# Patient Record
Sex: Female | Born: 1960 | Race: White | Hispanic: No | Smoking: Never smoker
Health system: Southern US, Community
[De-identification: ages and names within clinical notes are randomized; demographics above are authoritative.]

## PROBLEM LIST (undated history)

## (undated) DIAGNOSIS — E785 Hyperlipidemia, unspecified: Secondary | ICD-10-CM

## (undated) DIAGNOSIS — J301 Allergic rhinitis due to pollen: Secondary | ICD-10-CM

## (undated) DIAGNOSIS — F32A Depression, unspecified: Secondary | ICD-10-CM

## (undated) DIAGNOSIS — G8929 Other chronic pain: Secondary | ICD-10-CM

## (undated) DIAGNOSIS — F419 Anxiety disorder, unspecified: Secondary | ICD-10-CM

## (undated) DIAGNOSIS — F329 Major depressive disorder, single episode, unspecified: Secondary | ICD-10-CM

## (undated) DIAGNOSIS — G43909 Migraine, unspecified, not intractable, without status migrainosus: Secondary | ICD-10-CM

## (undated) DIAGNOSIS — D649 Anemia, unspecified: Secondary | ICD-10-CM

## (undated) DIAGNOSIS — M797 Fibromyalgia: Secondary | ICD-10-CM

## (undated) DIAGNOSIS — K219 Gastro-esophageal reflux disease without esophagitis: Secondary | ICD-10-CM

## (undated) DIAGNOSIS — M549 Dorsalgia, unspecified: Secondary | ICD-10-CM

## (undated) DIAGNOSIS — N201 Calculus of ureter: Secondary | ICD-10-CM

## (undated) DIAGNOSIS — K5909 Other constipation: Secondary | ICD-10-CM

## (undated) DIAGNOSIS — I10 Essential (primary) hypertension: Secondary | ICD-10-CM

## (undated) DIAGNOSIS — I82409 Acute embolism and thrombosis of unspecified deep veins of unspecified lower extremity: Secondary | ICD-10-CM

## (undated) DIAGNOSIS — K648 Other hemorrhoids: Secondary | ICD-10-CM

## (undated) HISTORY — DX: Fibromyalgia: M79.7

## (undated) HISTORY — DX: Anxiety disorder, unspecified: F41.9

## (undated) HISTORY — DX: Acute embolism and thrombosis of unspecified deep veins of unspecified lower extremity: I82.409

## (undated) HISTORY — DX: Depression, unspecified: F32.A

## (undated) HISTORY — DX: Other hemorrhoids: K64.8

## (undated) HISTORY — DX: Other chronic pain: G89.29

## (undated) HISTORY — DX: Anemia, unspecified: D64.9

## (undated) HISTORY — DX: Dorsalgia, unspecified: M54.9

## (undated) HISTORY — DX: Other constipation: K59.09

## (undated) HISTORY — PX: WISDOM TOOTH EXTRACTION: SHX21

## (undated) HISTORY — DX: Hyperlipidemia, unspecified: E78.5

## (undated) HISTORY — DX: Migraine, unspecified, not intractable, without status migrainosus: G43.909

## (undated) HISTORY — DX: Major depressive disorder, single episode, unspecified: F32.9

## (undated) HISTORY — DX: Calculus of ureter: N20.1

## (undated) HISTORY — DX: Allergic rhinitis due to pollen: J30.1

## (undated) HISTORY — DX: Essential (primary) hypertension: I10

## (undated) HISTORY — DX: Gastro-esophageal reflux disease without esophagitis: K21.9

---

## 1998-11-17 ENCOUNTER — Other Ambulatory Visit: Admission: RE | Admit: 1998-11-17 | Discharge: 1998-11-17 | Payer: Self-pay | Admitting: Obstetrics and Gynecology

## 1998-12-03 ENCOUNTER — Encounter (INDEPENDENT_AMBULATORY_CARE_PROVIDER_SITE_OTHER): Payer: Self-pay | Admitting: Specialist

## 1998-12-03 ENCOUNTER — Inpatient Hospital Stay (HOSPITAL_COMMUNITY): Admission: RE | Admit: 1998-12-03 | Discharge: 1998-12-05 | Payer: Self-pay | Admitting: Obstetrics and Gynecology

## 1999-03-07 HISTORY — PX: PARTIAL HYSTERECTOMY: SHX80

## 2000-02-14 ENCOUNTER — Other Ambulatory Visit: Admission: RE | Admit: 2000-02-14 | Discharge: 2000-02-14 | Payer: Self-pay | Admitting: Obstetrics and Gynecology

## 2000-02-14 ENCOUNTER — Encounter (INDEPENDENT_AMBULATORY_CARE_PROVIDER_SITE_OTHER): Payer: Self-pay

## 2002-01-01 ENCOUNTER — Inpatient Hospital Stay (HOSPITAL_COMMUNITY): Admission: EM | Admit: 2002-01-01 | Discharge: 2002-01-07 | Payer: Self-pay | Admitting: Psychiatry

## 2002-01-15 ENCOUNTER — Inpatient Hospital Stay (HOSPITAL_COMMUNITY): Admission: EM | Admit: 2002-01-15 | Discharge: 2002-01-20 | Payer: Self-pay | Admitting: Psychiatry

## 2002-03-20 ENCOUNTER — Ambulatory Visit (HOSPITAL_BASED_OUTPATIENT_CLINIC_OR_DEPARTMENT_OTHER): Admission: RE | Admit: 2002-03-20 | Discharge: 2002-03-20 | Payer: Self-pay | Admitting: Urology

## 2002-03-20 ENCOUNTER — Encounter: Payer: Self-pay | Admitting: Urology

## 2002-07-03 ENCOUNTER — Other Ambulatory Visit: Admission: RE | Admit: 2002-07-03 | Discharge: 2002-07-03 | Payer: Self-pay | Admitting: Obstetrics and Gynecology

## 2004-04-12 ENCOUNTER — Ambulatory Visit (HOSPITAL_COMMUNITY): Admission: RE | Admit: 2004-04-12 | Discharge: 2004-04-12 | Payer: Self-pay | Admitting: *Deleted

## 2005-08-07 ENCOUNTER — Encounter: Admission: RE | Admit: 2005-08-07 | Discharge: 2005-08-07 | Payer: Self-pay | Admitting: Endocrinology

## 2006-04-11 ENCOUNTER — Emergency Department (HOSPITAL_COMMUNITY): Admission: EM | Admit: 2006-04-11 | Discharge: 2006-04-11 | Payer: Self-pay | Admitting: Emergency Medicine

## 2007-03-07 HISTORY — PX: BUNIONECTOMY: SHX129

## 2007-04-03 ENCOUNTER — Encounter: Payer: Self-pay | Admitting: Emergency Medicine

## 2007-04-04 ENCOUNTER — Encounter (INDEPENDENT_AMBULATORY_CARE_PROVIDER_SITE_OTHER): Payer: Self-pay | Admitting: Internal Medicine

## 2007-04-04 ENCOUNTER — Observation Stay (HOSPITAL_COMMUNITY): Admission: EM | Admit: 2007-04-04 | Discharge: 2007-04-04 | Payer: Self-pay | Admitting: Internal Medicine

## 2007-08-20 ENCOUNTER — Observation Stay (HOSPITAL_COMMUNITY): Admission: AD | Admit: 2007-08-20 | Discharge: 2007-08-21 | Payer: Self-pay | Admitting: Endocrinology

## 2007-08-21 ENCOUNTER — Ambulatory Visit: Payer: Self-pay | Admitting: Vascular Surgery

## 2007-08-21 ENCOUNTER — Encounter (INDEPENDENT_AMBULATORY_CARE_PROVIDER_SITE_OTHER): Payer: Self-pay | Admitting: Internal Medicine

## 2008-06-16 ENCOUNTER — Emergency Department (HOSPITAL_BASED_OUTPATIENT_CLINIC_OR_DEPARTMENT_OTHER): Admission: EM | Admit: 2008-06-16 | Discharge: 2008-06-17 | Payer: Self-pay | Admitting: Emergency Medicine

## 2009-06-17 ENCOUNTER — Encounter: Admission: RE | Admit: 2009-06-17 | Discharge: 2009-06-17 | Payer: Self-pay | Admitting: Neurosurgery

## 2010-06-15 LAB — POCT TOXICOLOGY PANEL

## 2010-06-15 LAB — URINALYSIS, ROUTINE W REFLEX MICROSCOPIC
Bilirubin Urine: NEGATIVE
Hgb urine dipstick: NEGATIVE
Nitrite: NEGATIVE
Specific Gravity, Urine: 1.012 (ref 1.005–1.030)
pH: 6.5 (ref 5.0–8.0)

## 2010-06-22 ENCOUNTER — Ambulatory Visit (INDEPENDENT_AMBULATORY_CARE_PROVIDER_SITE_OTHER): Payer: BC Managed Care – PPO | Admitting: Gastroenterology

## 2010-06-22 ENCOUNTER — Encounter: Payer: Self-pay | Admitting: Gastroenterology

## 2010-06-22 VITALS — BP 112/74 | HR 80 | Ht 66.0 in | Wt 130.0 lb

## 2010-06-22 DIAGNOSIS — R1319 Other dysphagia: Secondary | ICD-10-CM

## 2010-06-22 DIAGNOSIS — K5900 Constipation, unspecified: Secondary | ICD-10-CM

## 2010-06-22 DIAGNOSIS — K219 Gastro-esophageal reflux disease without esophagitis: Secondary | ICD-10-CM

## 2010-06-22 MED ORDER — OMEPRAZOLE 40 MG PO CPDR: 40.0000 mg | DELAYED_RELEASE_CAPSULE | Freq: Two times a day (BID) | ORAL | Status: DC

## 2010-06-22 NOTE — Patient Instructions (Signed)
You have been scheduled for a Upper Endoscopy at [facility]. Separate instruction sheet given. Your prescription has been sent to your pharmacy.  Cc: Pearson Grippe, MD

## 2010-06-22 NOTE — Progress Notes (Signed)
History of Present Illness: This is a 50 year old female with a several year history of GERD maintained on omeprazole 40mg  po bid. She has frequent throat clearing felt possible due to GERD. She was recently increased to omeprazole 40mg  po qid without any improvement in her symptoms. She notes solid food dysphagia for the past 3 months that happens almost on a daily basis. She states she underwent upper endoscopy by Dr. Virginia Rochester but I cannot locate the records. She has chronic constipation and is maintained on Amitiza and Senna with good control of her symptoms. She previously underwent colonoscopy by Dr. Virginia Rochester in February 2006 which showed internal and no other abnormalities.  She denies painful swallowing, weight loss, abdominal pain, rectal pain, melena, hematochezia, chest pain.  Past Medical History  Diagnosis Date  . DVT (deep venous thrombosis)   . Depression   . Migraines   . Chronic back pain   . Left ureteral calculus   . Anxiety   . Hypertension   . Internal hemorrhoids   . Fibromyalgia   . GERD (gastroesophageal reflux disease)   . Chronic constipation   . Vitamin D deficiency   . Hyperlipidemia   . Hayfever    Past Surgical History  Procedure Date  . Cesarean section 1984  . Partial hysterectomy 2001  . Bunionectomy 2009    reports that she has never smoked. She has never used smokeless tobacco. She reports that she does not drink alcohol or use illicit drugs. family history includes Bipolar disorder in her mother; Diabetes in her maternal grandmother; Hyperlipidemia in her father; Osteoarthritis in her mother; and Stroke in her maternal grandfather.  There is no history of Colon cancer. Allergies  Allergen Reactions  . Mirapex (Pramipexole Dihydrochloride)   . Sulfa Drugs Cross Reactors    Outpatient Encounter Prescriptions as of 06/22/2010  Medication Sig Dispense Refill  . buPROPion (WELLBUTRIN XL) 300 MG 24 hr tablet Take 300 mg by mouth daily.        . Cholecalciferol  (VITAMIN D3) 2000 UNITS TABS Take 1 tablet by mouth daily.        Marland Kitchen gabapentin (NEURONTIN) 600 MG tablet Take 600 mg by mouth 3 (three) times daily.        Marland Kitchen lamoTRIgine (LAMICTAL) 100 MG tablet Take 100 mg by mouth 2 (two) times daily.        Marland Kitchen lubiprostone (AMITIZA) 8 MCG capsule Take 16 mcg by mouth. 2 in the morning and 2 at night      . mirtazapine (REMERON) 15 MG tablet Take 7.5 mg by mouth at bedtime.        . Multiple Vitamin (MULTIVITAMIN) tablet Take 1 tablet by mouth daily.        . NON FORMULARY Allergy shot twice weekly       . omeprazole (PRILOSEC) 40 MG capsule Take 1 capsule (40 mg total) by mouth 2 (two) times daily.  60 capsule  11  . Sennosides (SENNA LAX PO) Take by mouth. 4 in the morning and 4 at night       . SUMAtriptan (IMITREX) 50 MG tablet Take 50 mg by mouth as needed.        . topiramate (TOPAMAX) 50 MG tablet 50 mg. 2 tablets by mouth in the morning and 1 tablet in the evening.       . traZODone (DESYREL) 100 MG tablet Take 100 mg by mouth at bedtime.        . triamterene-hydrochlorothiazide (MAXZIDE-25)  37.5-25 MG per tablet Take 1 tablet by mouth daily.        Marland Kitchen DISCONTD: omeprazole (PRILOSEC) 40 MG capsule Take 40 mg by mouth. Takes one tablet by four times daily      . DISCONTD: polyethylene glycol (MIRALAX / GLYCOLAX) packet Take 17 g by mouth every other day.           Review of Systems: Back pain, headaches, increased frequency of urination and vocal changes. Pertinent positive and negative review of systems were noted in the above HPI section. All other review of systems were otherwise negative.  Physical Exam: General: Well developed , well nourished, no acute distress Head: Normocephalic and atraumatic Eyes:  sclerae anicteric, EOMI Ears: Normal auditory acuity Mouth: No deformity or lesions Neck: Supple, no masses or thyromegaly Lungs: Clear throughout to auscultation Heart: Regular rate and rhythm; no murmurs, rubs or bruits Abdomen: Soft, non  tender and non distended. No masses, hepatosplenomegaly or hernias noted. Normal Bowel sounds Musculoskeletal: Symmetrical with no gross deformities  Skin: No lesions on visible extremities Pulses:  Normal pulses noted Extremities: No clubbing, cyanosis, edema or deformities noted Neurological: Alert oriented x 4, grossly nonfocal Cervical Nodes:  No significant cervical adenopathy Inguinal Nodes: No significant inguinal adenopathy Psychological:  Alert and cooperative. Normal mood and affect  Assessment and Recommendations:  1. GERD. Possible LPR. Continue omeprazole 40 mg twice daily and standard antireflux measures. I offered to change her to another proton pump inhibitor to attempt to control her throat clearing, which may be secondary to reflux, however she prefers to remain on omeprazole.  2. Dysphasia. Suspected peptic stricture. Rule out other etiologies including neoplasms. The risks, benefits, and alternatives to endoscopy with possible biopsy and possible dilation were discussed with the patient and they consent to proceed. Given her medications and her sedation requirements for her colonoscopy I recommended that she undergo sedation with propofol however she declined due to the timing and scheduling.   3. Colorectal cancer screening. Average risk for colorectal cancer. Screening colonoscopy February 2016.

## 2010-06-27 ENCOUNTER — Encounter: Payer: Self-pay | Admitting: Gastroenterology

## 2010-06-28 ENCOUNTER — Telehealth: Payer: Self-pay | Admitting: *Deleted

## 2010-06-28 ENCOUNTER — Encounter: Payer: Self-pay | Admitting: Gastroenterology

## 2010-06-28 ENCOUNTER — Telehealth: Payer: Self-pay | Admitting: Gastroenterology

## 2010-06-28 NOTE — Telephone Encounter (Signed)
Spoke with patient. Advised to change her diet at midnight tonight to prep for EGD. She verbalized understanding.

## 2010-06-28 NOTE — Telephone Encounter (Signed)
Spoke with pt. Advised to start EGD prep diet at midnight.

## 2010-06-29 ENCOUNTER — Encounter: Payer: Self-pay | Admitting: Gastroenterology

## 2010-06-29 ENCOUNTER — Ambulatory Visit (AMBULATORY_SURGERY_CENTER): Payer: BC Managed Care – PPO | Admitting: Gastroenterology

## 2010-06-29 DIAGNOSIS — K2970 Gastritis, unspecified, without bleeding: Secondary | ICD-10-CM

## 2010-06-29 DIAGNOSIS — R1319 Other dysphagia: Secondary | ICD-10-CM

## 2010-06-29 DIAGNOSIS — R1310 Dysphagia, unspecified: Secondary | ICD-10-CM

## 2010-06-29 DIAGNOSIS — K2940 Chronic atrophic gastritis without bleeding: Secondary | ICD-10-CM

## 2010-06-29 DIAGNOSIS — K219 Gastro-esophageal reflux disease without esophagitis: Secondary | ICD-10-CM

## 2010-06-29 DIAGNOSIS — K2990 Gastroduodenitis, unspecified, without bleeding: Secondary | ICD-10-CM

## 2010-06-29 MED ORDER — SODIUM CHLORIDE 0.9 % IV SOLN: 500.0000 mL | INTRAVENOUS | Status: DC

## 2010-06-29 NOTE — Patient Instructions (Signed)
Discharged instructions given with verbal understanding.  Handouts on gastritis, gerd, and a dilatation diet given. Resume previous medications.

## 2010-06-30 ENCOUNTER — Telehealth: Payer: Self-pay

## 2010-06-30 NOTE — Telephone Encounter (Signed)

## 2010-07-01 ENCOUNTER — Telehealth: Payer: Self-pay

## 2010-07-01 NOTE — Telephone Encounter (Signed)
Patient notified of GES scheduled for [facility] 07/19/10 8:00

## 2010-07-04 ENCOUNTER — Encounter: Payer: Self-pay | Admitting: Gastroenterology

## 2010-07-07 ENCOUNTER — Telehealth: Payer: Self-pay | Admitting: Gastroenterology

## 2010-07-07 NOTE — Telephone Encounter (Signed)
Patient is advised to keep her appt for GES

## 2010-07-11 ENCOUNTER — Telehealth: Payer: Self-pay | Admitting: Gastroenterology

## 2010-07-12 ENCOUNTER — Telehealth: Payer: Self-pay

## 2010-07-12 MED ORDER — PANTOPRAZOLE SODIUM 40 MG PO TBEC: 40.0000 mg | DELAYED_RELEASE_TABLET | Freq: Two times a day (BID) | ORAL | Status: DC

## 2010-07-12 NOTE — Telephone Encounter (Signed)
Spoke with patient and informed her of Dr. Ardell Isaacs recommendations. She states she has not tried Protonix but thinks she has tried the others. I told her that we would send in the Pantoprazole and she can call us back if this medication does not help. Pt agreed and verbalized understanding.

## 2010-07-12 NOTE — Telephone Encounter (Signed)
Left a message for pt to call me back

## 2010-07-12 NOTE — Telephone Encounter (Signed)
Trial of a stonger PPI bid: Nexium 40 mg or pantoprazole 40 mg or Aciphex 20 mg in place of omeprazole

## 2010-07-12 NOTE — Telephone Encounter (Signed)
Left another message with patient letting her know that we will send another PPI to her pharmacy in place of the omeprazole. Told her to call me back to let me know which PPI she has tried in the past and I can send her to her pharmacy.

## 2010-07-12 NOTE — Telephone Encounter (Signed)
Patient states she is having a root canal today and states she is still paying on other bills that she cannot afford. She states she would like to cancel her GES at this time due to insufficient funds. She wants to see if there is something else we could do as far as medication changes to help with this problem. I told her of the importance of this test but she still would like to cancel at this time. Please advise.

## 2010-07-18 ENCOUNTER — Telehealth: Payer: Self-pay | Admitting: Gastroenterology

## 2010-07-18 NOTE — Telephone Encounter (Signed)
Patient states she is still having solid food dysphagia and continued globus sensation in her throat even after the EGD with dilitation. She also states after switching her PPI to pantoprazole from omeprazole, it has not really helped her symptoms even when increased to twice a day. I asked patient if she tried anti-reflux measures with increasing her PPI. She states she has tried everything.  I told her that we might have to switch her medicine again or she might have to decide to pay for the GES that Dr. Russella Dar recommended in her EGD report. Pt states she would really like see if there was a alternate thing that could be done with medicine first. Told patient that I would ask. Please advise.

## 2010-07-18 NOTE — Telephone Encounter (Signed)
I have not found any definite GI problem causing her symptoms. She might have GERD so a PPI that she is willing to take could be helpful. A GES could help determine if she has gastroparesis exacerbating her symptoms. She might have an ENT or other disorder causing globus and swallowing problems so she needs an ENT evaluation as the next step.

## 2010-07-19 ENCOUNTER — Other Ambulatory Visit (HOSPITAL_COMMUNITY): Payer: BC Managed Care – PPO

## 2010-07-19 MED ORDER — ESOMEPRAZOLE MAGNESIUM 40 MG PO CPDR: 40.0000 mg | DELAYED_RELEASE_CAPSULE | Freq: Two times a day (BID) | ORAL | Status: DC

## 2010-07-19 NOTE — H&P (Signed)
NAMECHASITTY, HEHL NO.:  [id number]   MEDICAL RECORD NO.:  [id number]          PATIENT TYPE:  INP   LOCATION:  1530                         FACILITY:  [facility]   PHYSICIAN:  Lonia Blood, M.D.       DATE OF BIRTH:  11/22/1960   DATE OF ADMISSION:  08/20/2007  DATE OF DISCHARGE:                              HISTORY & PHYSICAL   PRIMARY CARE PHYSICIAN:  Brooke Bonito, M.D.   CHIEF COMPLAINT:  Left side pain.   HISTORY OF PRESENT ILLNESS:  Ms. Feuerstein is a 50 year old woman with a  past medical history of depression, who presented to her primary care  physician today with complaints of severe left leg pain.  The patient  reports that she and been having this pain for the past 4 days.  According to her husband, the patient has been on regiment of tight Ace  wraps to the left and right knee, and she has been using them to  stabilize a ballotable patella.  The patient denies any prior deep  venous thrombosis.  She denies any prior PEs.  Denies any family history  of clotting disorders.  The patient denies chest pain, shortness of  breath, coughing and/or hemoptysis.   PAST MEDICAL HISTORY:  .  Depression.  1. Chronic migraines.  2. Chronic back pain.  3. Hysterectomy.  4. Left ureteral calculus.  5. Anxiety.  6. Hypertension.   HOME MEDICATIONS.:  1. Pristiq.  2. Ritalin 20 mg 1-1/2 tablets in the morning and 1 tablet at noon.  3. Neurontin 1200 mg 3 times a day.  4. Wellbutrin XL 450 mg in the morning.  5. Lamictal 100 mg in the morning.  6. Lunesta 3 mg at that time.  7. Topamax 50 mg at 7 a.m. and 50 mg at 3:00 p.m.  8. Triamterene/hydrochlorothiazide 37.5 mg daily.  9. Tramadol 50 mg every 8 hours as needed.  10.Zomig 5 mg as needed for migraines.  11.Zanaflex 4 mg every 8 hours as needed for muscle relaxation.  12.MiraLax 17 grams daily as needed for constipation.  13.Multivitamin daily.   SOCIAL HISTORY:  The patient is married.  Denies any smoking or  alcohol.   FAMILY HISTORY:  Positive for stroke in grandfather.  Positive for  atherosclerosis in father.   ALLERGIES:  THE PATIENT IS ALLERGIC TO SULFA, PENICILLIN AND STATINS.   REVIEW OF SYSTEMS:  As per HPI, other systems are reviewed and are  negative.   PHYSICAL EXAMINATION:  Upon admission, temperature 97.1, pulse 95,  respirations 16, blood pressure 134/73, saturation 96% on room air.  She  appears a well-developed, well-nourished woman, in no acute distress.  Sitting on a stretcher, alert and oriented to place, person, and time.  HEENT:  Head normocephalic, atraumatic.  Eyes, pupils equal and round,  react to light and accommodation.  Extraocular movements intact, wearing  glasses.  Throat clear.  NECK:  Supple.  No JVD.  CHEST: Clear to auscultation without wheezes, rhonchi or crackles.  HEART:  Regular rate and rhythm, without murmurs, rubs or gallops.  ABDOMEN:  Soft,  nontender.  Normoactive bowel sounds are present.  LOWER EXTREMITIES: +1 edema on the left, no edema on the right.  Homans  sign  is positive on the left.  SKIN:  Warm, dry without any suspicious rashes with a tan from an  artificial tanning bed.   LABORATORY VALUES:  On admission, everything is pending.  There are  venous Dopplers from the outside office that is positive for partially  occluded popliteal vein with a deep venous thrombosis with partial  recanalization on the left.   ASSESSMENT AND PLAN:  1. Deep venous thrombosis.  I will place Ms. Rodrigue on observation      overnight.  Repeat her Dopplers tomorrow.  I do not find signs of      acute pulmonary emboli, but will closely monitor the patient.  She      will be started on Lovenox and Coumadin and educated through the      night.  2. Depression.  Ms. Alcorta will be continued on her chronic      medications including Pristiq, Wellbutrin, Topamax.  3. Chronic pain syndrome.  The patient will be continued on Neurontin      and  Tramadol.      Lonia Blood, M.D.  Electronically Signed     SL/MEDQ  D:  08/20/2007  T:  08/21/2007  Job:  [id number]   cc:   Brooke Bonito, M.D.  Fax: [phone]

## 2010-07-19 NOTE — Telephone Encounter (Signed)
Patient states she is willing to try another PPI. She would like to try Nexium twice daily. I told her I would send this to her pharmacy. Told her what Dr. Russella Dar recommended for the next step with a ENT referral. Patient states she thinks she saw a ENT doctor many years ago and they did a lot of tests on her and she states she did not like the doctor. She states she does not want another referral at this time to ENT but would try another medicine. She also does not want to do the GES at this time still.

## 2010-07-19 NOTE — Discharge Summary (Signed)
NAMEGWENITH, [name]               ACCOUNT NO.:  [id number]   MEDICAL RECORD NO.:  [id number]          PATIENT TYPE:  INP   LOCATION:  1530                         [facility]ACILITY:  [facility]   PHYSICIAN:  Hillery Aldo, M.D.   DATE OF BIRTH:  June 10, 1960   DATE OF ADMISSION:  08/20/2007  DATE OF DISCHARGE:  08/21/2007                               DISCHARGE SUMMARY   PRIMARY CARE PHYSICIAN:  Brooke Bonito, M.D.   DISCHARGE DIAGNOSES:  1. Deep venous thrombosis, resolving.  2. Depression.  3. History of migraine headaches.  4. Chronic back pain.  5. History of left ureteral calculus.  6. Anxiety disorder.  7. Hypertension.  8. Hypokalemia.   DISCHARGE MEDICATIONS:  1. Pristiq 150 mg, 3 tablets q.a.m.  2. Methylphenidate 20 mg, 1-1/2 tablets in the morning, 12:00 p.m. and      1:00 p.m.  3. Gabapentin 600 mg 2 tablets t.i.d.  4. Alprazolam 1 mg q.i.d.  5. Wellbutrin XL 150 mg 3 tablets q.a.m.  6. Lamictal 100 mg, q.a.m.  7. Lunesta 3 mg nightly p.r.n.  8. Topamax 50 mg every 7:00 a.m. and 3:00 p.m.  9. Triamterine hydrochlorothiazide 37.5/25 one tablet daily.  10.Tramadol 50 mg every 8 hours p.r.n.  11.Xomed 5 mg p.r.n. migraine headaches.  12.Zanaflex 4 mg every 8 hours p.r.n.  13.Polyethelene glycol 17 grams daily as needed.  14.Ultimate Woman 2 tablets daily.  15.Coumadin 7.5 mg daily or as directed.  16.Lovenox 110 mg by injection subcutaneously daily until instructed      to discontinue.   CONSULTATIONS:  Pharmacy for Coumadin/Lovenox dosing.   BRIEF ADMISSION HISTORY OF PRESENT ILLNESS:  Patient is a 50 year old  female who was directed to come to the emergency department by her  primary care physician after she was found to have evidence of a deep  venous thrombosis.  Apparently, she had a Doppler done at her  physician's office, which showed a partial recannulization of a left  popliteal DVT.  She had 2 weeks of swelling prior to this.  Apparently,  she has been treating  chronic patella pain with bands that support her  patellas bilaterally, but are likely somewhat restrictive.  She was  admitted for initiation of anticoagulation therapy.   PROCEDURES/DIAGNOSTIC STUDIES:  Repeat bilateral Dopplers on August 21, 2007 showed negative for DVT bilaterally with the left popliteal vein  patent.  There was superficial thrombus noted in the left lesser  saphenous vein.   DISCHARGE LABORATORY VALUES:  Chemistries were normal with the exception  of slightly low potassium at 3.4.  PT was 12.2, INR 0.9.  CBC was within  the normal limits.   HOSPITAL COURSE BY PROBLEM:  1. DVT:  Patient was admitted with a presumptive diagnosis of DVT      based on outpatient Dopplers.  Dopplers were repeated and showed      clearance of the deep venous thrombosis and ongoing clot in the      superficial lesser saphenous vein.  Given the preponderance of the      evidence that she likely did have a deep  venous clot that has      recannulized, I think it is reasonable to treat her for 6 months      with anticoagulation therapy.  She has been instructed to      discontinue use of the patella supports.  At this time, I do not      think a hypercoagulability workup is needed.  Patient will be      taught how to self-administer Lovenox and be discontinued on a      combination of Lovenox and Coumadin.  She is instructed to follow      up with Dr. Juleen China for lab work daily until instructed to stop.  2. Hypokalemia:  Patient was given appropriate p.o. repletion prior to      discharge and put on daily supplementation given that she is being      directed with a diuretic.   DISPOSITION:  Patient is medically stable and will be discharged home.      Hillery Aldo, M.D.  Electronically Signed     CR/MEDQ  D:  08/21/2007  T:  08/21/2007  Job:  [id number]   cc:   Brooke Bonito, M.D.  Fax: [phone]

## 2010-07-19 NOTE — Telephone Encounter (Signed)
Addended by: Christie Nottingham on: 07/19/2010 12:06 PM   Modules accepted: Orders

## 2010-07-19 NOTE — H&P (Signed)
[name], [name]               ACCOUNT NO.:  [id number]   MEDICAL RECORD NO.:  [id number]          PATIENT TYPE:  EMS   LOCATION:  ED                           FACILITY:  [facility]   PHYSICIAN:  Herbie Saxon, MDDATE OF BIRTH:  [birth year]   DATE OF ADMISSION:  04/03/2007  DATE OF DISCHARGE:                              HISTORY & PHYSICAL   She is a full code.   PRESENTING COMPLAINT:  Chest pain, 4 1/2 hours.   HISTORY OF PRESENTING COMPLAINT:  This is a 50 year old female who had  been in good state of health until a week ago when she took some  Celebrex for back pain and she started having intermittent diarrhea.  However, tonight she was reading in bed around 6:30 p.m., when suddenly  developed severe retrosternal chest pain which started under her left  breast, radiating to her back, associated with shortness of breath,  diaphoresis, nausea, lightheadedness.  She denies any frank syncopal  episode, no palpitations, no paroxysmal nocturnal dyspnea, no orthopnea,  no pedal swelling.  Patient also has intermittent headache because of  her history of migraine.  Patient had no preceding cough, fever or  wheezing, no hemoptysis.  There is no new skin rash or joint swelling.  Patient's chest pain  was ameliorated after nitroglycerin and aspirin at  the emergency room. Chest pain at present is 2/10, initially was 10/10  at presentation.  She had no preceding history of chest pain and  actually patient used to do exercises at 45 minutes on the elliptical at  home with no symptoms.   PAST MEDICAL HISTORY:  1. Migraine headache.  2. Depression.  3. Constipation.  4. Chronic back pain.  5. Peripheral edema.   FAMILY HISTORY:  Grandfather had CVA.   SOCIAL HISTORY:  She is married.  She has one child.  She does not  drink, smoke or use illicit drugs.   PAST SURGERIES:  1. Hysterectomy.  2. Cesarean section.  3. Ovarian cyst.  4. Ectopic pregnancy.   REVIEW OF SYSTEMS:   Twelve systems reviewed, pertinent positives as  already dictated in the history of presenting complaint.   MEDICATIONS:  1. Triamterine/hydrochlorothiazide 37.5/25 one daily.  2. Tramadol 50 mg q.8 h. p.r.n  3. Gabapentin 600 mg t.i.d.  4. Alprazolam 1 mg q.6 h.  5. Wellbutrin XL 150 mg daily.  6. Lamictal 100 mg daily.  7. Lunesta 3 mg night  8. Zanaflex 5 mg p.r.n.  9. Topamax 50 mg b.i.d.  10.MiraLax 17 gm p.r.n.  11.Multivitamin 1 tablet daily.  12.KCl 99 mg daily.   She is allergic to PENICILLIN AND SULFA.   On examination, she is a middle-aged lady not in acute respiratory  distress.  Temperature is 98, pulse 76, respiratory rate 20, blood pressure 131/83.  Pupils equal, round, reactive to light in accommodation.  Extraocular  muscles intact.  HEAD:  Atraumatic, normocephalic.  Mucous membranes are moist.  NECK:  Supple, no elevated JVD, no thyromegaly.  CHEST:  Clinically clear.  HEART:  Sounds 1 and 2, regular rate and rhythm.  ABDOMEN:  Benign.  She is alert relative to time, place and person.  Cranial nerves II-XII  intact.  __________ globally.  Sensory system is normal.  Peripheral pulses present, no pedal edema.    Available labs show the EKG is normal sinus rhythm at 73 per minute, SVT  abnormality inferior lateral and anterior leads.  WBCs 11, hematocrit is  40, platelet count is 329.  Troponin is less than 0.05.  Sodium is 140,  potassium 3.1, chloride 105, bicarbonate 27, glucose 105, BUN 15,  creatinine 1.   ASSESSMENT:  Atypical chest pain.  Rule out acute coronary syndrome,  leukocytosis, hypokalemiasecondary to gastroenteritis.   The patient is to be admitted to telemetry bed for observation with  serial cardiac enzymes and EKG.  She is to be on morphine and oxygen  p.r.n., nitroglycerin paste 1/2 inch q.6 h., aspirin 325 mg daily.  We  will obtain a D-dimer, a CT chest and a stool for WBC, ova and parasite.  We will get fasting lipids and put her  on Lovenox 40 mg subcu daily and  Protonix 40 mg IV daily, with some Flagyl 500 mg p.o. t.i.d., Reglan  alternating with Phenergan p.r.n. for nausea.  DuoNeb q.6 h. p.r.n. for  wheezing.  Continue the home medications, hold the hydrochlorothiazide  because of the hypokalemia.   Her illness, medication and tests were explained to her, to which she  verbalized return understanding.  We are going to keep her NPO from  midnight for possible stress echo in a.m. and consider cardiology input  in a.m.  if cardiac enzymes are positive.      Herbie Saxon, MD  Electronically Signed    MIO/MEDQ  D:  04/03/2007  T:  04/04/2007  Job:  [id number]

## 2010-07-19 NOTE — Discharge Summary (Signed)
[name], [name]               ACCOUNT NO.:  [id number]   MEDICAL RECORD NO.:  [id number]          PATIENT TYPE:  INP   LOCATION:  4740                         FACILITY:  [facility]   PHYSICIAN:  Elliot Cousin, M.D.    DATE OF BIRTH:  April 21, 1960   DATE OF ADMISSION:  04/04/2007  DATE OF DISCHARGE:  04/04/2007                               DISCHARGE SUMMARY   DISCHARGE DIAGNOSES:  1. Chest pain, myocardial infarction ruled out.  Most likely etiology      is musculoskeletal in origin.  2. Leukocytosis.  Resolved during hospital course.  3. Hypokalemia.   DISCHARGE MEDICATIONS:  1. Vicodin 5 mg every 4-6 hours as needed (10 tablets prescribed).  2. Potassium chloride 20 mEq b.i.d. for 1 week.  3. Motrin or Tylenol as directed on the label and as needed.  4. Triamterene HCTZ 37.5 mg daily.  5. Methylphenidate 20 mg t.i.d.  6. EpiPen 600 mg t.i.d.  7. Alprazolam 1 mg q.i.d.  8. Wellbutrin XL 150 mg daily.  9. Lamictal 100 mg daily.  10.Lunesta 3 mg q.h.s.  11.Zomig 5 mg daily.  12.Zanaflex 4 mg p.r.n.  13.MiraLax 17 grams in 8 ounces of beverage p.r.n.  14.Multivitamin once daily.   DISCHARGE DISPOSITION:  The patient was discharged home in improved and  stable condition.  She was advised to follow up with her primary care  physician Dr. Maryelizabeth Rowan in 1 week.   CONSULTATIONS:  None.   PROCEDURE PERFORMED:  1. A 2-D echocardiogram performed on April 04, 2007.  2. A CT scan of the chest on April 04, 2007.  The results revealed      no infiltrates, effusions, or pneumothorax.  Study not performed      specifically for PE.  3. Chest x-ray.  No acute cardiopulmonary disease.   HISTORY OF PRESENT ILLNESS:  The patient is a 50 year old woman with a  past medical history significant for back pain, anxiety, depression, and  peripheral edema.  She presented to the emergency department on April 03, 2007 with a chief complaint of chest pain.  When the patient was  evaluated  in the emergency department, her EKG revealed normal sinus  rhythm with nonspecific ST and T-wave abnormalities.  The patient was  therefore admitted for further evaluation and management.   For additional details please see the dictated history and physical  (currently not in the chart yet).   HOSPITAL COURSE:  1. CHEST PAIN.  As indicated above, the patient was hemodynamically      stable at the time of the initial hospital assessment..  She was      started empirically on beta blockade therapy with Toprol XL,      morphine as needed for pain, and empiric Nitropaste.  For further      evaluation, cardiac enzymes were ordered as well as a CT scan of      the chest and a D-dimer.  The cardiac enzymes were completely      normal during hospital course.  Her D-dimer was within normal      limits at  0.27.  However, a CT scan was ordered.  The CT scan of      the chest revealed no evidence of PE, although the radiologist did      mention that the chest CT was not meant to be directly related to      ruling out a pulmonary embolism.  The CT otherwise revealed no      acute findings.  Over the course of the hospitalization, the      patient's pain subsided and then completely resolved.  Upon further      questioning, the patient reported that she had recently undergone      several manipulations by a chiropractor for lower and mid-back      pain.  The chest pain did not start until after the manipulations      of her back were initiated.  Given the normal cardiac enzymes and      relatively normal CT scan of the chest, more than likely, the      patient's chest pain was musculoskeletal in origin.  A follow-up      EKG revealed sinus bradycardia with no ST or T-wave abnormalities.      The sinus bradycardia was thought to be secondary to Toprol      therapy.  The patient was completely without chest pain at the time      of hospital discharge.  Her blood pressure was within normal       limits.  Of note, the results of the 2-D echocardiogram are      pending.  The echo was performed today.  The patient was advised to      call the dictating physician for the results next week.  She voiced      understanding.  2. HYPOKALEMIA.  The patient has a history of hypokalemia and is      treated with potassium supplementation in the outpatient setting.      Her serum potassium was 3.1 on admission.  It fell to 3 today.  The      patient will be given 30 mEq of potassium chloride now and then      sent home on potassium chloride b.i.d. for one more week.      Elliot Cousin, M.D.  Electronically Signed     DF/MEDQ  D:  04/04/2007  T:  04/05/2007  Job:  [id number]   cc:   Maryelizabeth Rowan, M.D.  Elliot Cousin, M.D.

## 2010-07-20 ENCOUNTER — Other Ambulatory Visit (HOSPITAL_COMMUNITY): Payer: BC Managed Care – PPO

## 2010-07-22 NOTE — Op Note (Signed)
NAME:  [name], [name]                   ACCOUNT NO.:  [id number]   MEDICAL RECORD NO.:  [id number]          PATIENT TYPE:  AMB   LOCATION:  ENDO                         FACILITY:  [facility]   PHYSICIAN:  Georgiana Spinner, M.D.    DATE OF BIRTH:  1961-02-01   DATE OF PROCEDURE:  04/12/2004  DATE OF DISCHARGE:                                 OPERATIVE REPORT   PROCEDURE:  Colonoscopy.   INDICATIONS:  Rectal bleeding.   ANESTHESIA:  Demerol 120, Versed 12 mg, Phenergan 25 mg.   PROCEDURE IN DETAIL:  With the patient mildly sedated in the left lateral  decubitus position, a rectal examination was performed and revealed internal  hemorrhoids.  Subsequently, the videoscopic colonoscope was inserted in the  rectum and passed under direct vision to the cecum, identified by the  ileocecal valve and appendiceal orifice, both of which were photographed.  From this point, the colonoscope was fully withdrawn, taking __________  views of the colonic mucosa.  The scope was withdrawn normally into the  rectum, which appeared normal.  The rectum showed hemorrhoids on retroflex  view.  The endoscope was withdrawn.  The patient's vital signs and pulses  remained stable.  The patient tolerated the procedure well without apparent  complications.   FINDINGS:  Internal hemorrhoids, otherwise unremarkable examination.   PLAN:  Have the patient follow up with me as an outpatient.      GMO/MEDQ  D:  04/12/2004  T:  04/12/2004  Job:  [id number]

## 2010-07-22 NOTE — Discharge Summary (Signed)
   NAME:  [name], [name]                         ACCOUNT NO.:  [id number]   MEDICAL RECORD NO.:  [id number]                   PATIENT TYPE:  IPS   LOCATION:  0502                                 [facility]   PHYSICIAN:  Geoffery Lyons, M.D.                   DATE OF BIRTH:  1960/06/17   DATE OF ADMISSION:  01/01/2002  DATE OF DISCHARGE:  01/07/2002                                 DISCHARGE SUMMARY   ADDENDUM:   COURSE IN HOSPITAL:  We went ahead and increased the ____________ to 200 in  the morning and Wellbutrin was increased to 150 twice a day.  Prozac was  further decreased to 40.  November 4, she was in full contact with reality  still, having a difficult time with concentration, still feeling depressed,  yet she was endorsing no suicidal ideas, no homicidal ideas and had been  tolerating medication well.  She was willing to continue medications and  allowing them to work, will continue to follow up on an outpatient basis at  mental health.  She has worked on Pharmacologist, on how being able to handle  her mother as well as how to be able to let go of her son.  There was a  family session that went well.  The family was supportive.  She was going to  be discharged to continue outpatient treatment.   DISCHARGE DIAGNOSES:   AXIS I:  1. Major depression, recurrent.  2. Anxiety disorder not otherwise specified.   AXIS II:  No diagnosis.   AXIS III:  Migraine headaches.   AXIS IV:  Moderate.   AXIS V:  Global assessment of function upon discharge 50-55.   DISCHARGE MEDICATIONS:  1. Trazodone 100 at bedtime as needed for sleep.  2. Xanax 0.5 4 times a day.  3. ____________ 200 mg, 1 in the morning.  4. Wellbutrin SR 150 twice a day.  5. Prozac 40 mg daily.  6. Zomig 5 mg as needed for migraine.   DISPOSITION:  Follow up at [facility].                                                Geoffery Lyons, M.D.   IL/MEDQ  D:  02/04/2002  T:   02/04/2002  Job:  [id number]

## 2010-07-22 NOTE — Discharge Summary (Signed)
NAME:  [name], [name]                         ACCOUNT NO.:  [id number]   MEDICAL RECORD NO.:  [id number]                   PATIENT TYPE:  IPS   LOCATION:  0502                                 [facility]   PHYSICIAN:  Geoffery Lyons, M.D.                   DATE OF BIRTH:  04-Jun-1960   DATE OF ADMISSION:  01/01/2002  DATE OF DISCHARGE:  01/07/2002                                 DISCHARGE SUMMARY   CHIEF COMPLAINT AND PRESENT ILLNESS:  This was the first admission to [facility] for this 50 year old married white female  voluntarily admitted.  She had a history of anxiety and depression, worrying  about her son.  She was not getting any better with the medications.  Her  mother was sent to [facility] the week prior to this  admission.  She had problems with initial and intermittent sleep, having a  decreased appetite with a 10 pound weight loss.  She had no active suicidal  thoughts, had thoughts about lying in bed and not eating for several days.  She had decreased concentration which seemed to be the main problem.  She  was afraid that she would say something stupid while talking to others.  No  psychotic symptoms, no homicidal ideas.  She had a history of depression  since 1984.   PAST PSYCHIATRIC HISTORY:  This was the first time at [facility].  She was last at [facility] and at [facility] for  depression.  She saw Hinton Lovely, M.D., [facility].   SUBSTANCE ABUSE HISTORY:  She denied the use or abuse of any substances.   PAST MEDICAL HISTORY:  Migraine headaches.   MEDICATIONS:  1. Zomig.  2. Prozac 80 mg per day.  3. Wellbutrin 150 mg.  4. Xanax two a day, increased up to four a day for the past two to three     weeks.   PHYSICAL EXAMINATION:  Physical examination was performed, failed to show  any acute findings.   MENTAL STATUS EXAM:  Mental status exam revealed a  middle-aged white female,  cooperative, good eye contact.  Speech was clear.  Mood was anxious.  Affect  was mildly anxious.  Thought processes: Coherent; no evidence of psychosis.  She expressed some passive suicidal ideas, no homicidal ideas, no auditory  hallucinations.  Cognitive: Intact.  Decreased concentration.  Memory was  fair.  Judgment was good.  Insight was fair.   ADMISSION DIAGNOSES:   AXIS I:  1. Major depression, recurrent.  2. Anxiety disorder, not otherwise specified.   AXIS II:  No diagnosis.   AXIS III:  Migraine headaches.   AXIS IV:  Moderate.   AXIS V:  Global assessment of functioning upon admission 30-35, highest  global assessment of functioning in the last year 65-70.   LABORATORY  DATA:  Other laboratory workup: CBC was within normal limits.  Blood chemistries were within normal limits.  Thyroid profile was within  normal limits.   HOSPITAL COURSE:  She was admitted and started in intensive individual and  group psychotherapy.  We went ahead and continued the Prozac up to 60 mg per  day, Wellbutrin slow release 150 mg in the morning, Xanax on a p.r.n. basis,  and Ambien for sleep.  Ambien was not effective so she was started on  trazodone 100 mg h.s. p.r.n. for sleep.  Prozac was increased back to 80 mg.  As it was felt that Prozac was not effective, we went ahead and started  weaning off the Prozac.  Her main concern was the depression, claimed to be  under a lot of stress, dealing with her mother who is bipolar and her son  who is abusing drugs.  She said she lost her ability to cope with the  stressors.  She endorsed dealing with a lot of anxiety, feeling overwhelmed,  no energy, no motivation, no concentration.  We went ahead and increased the  Wellbutrin to 200 mg per day.  She continued to express difficulty,  especially in the morning, sleepiness, having a hard time functioning when  she got up for several hours.   Dictation ended at this  point.                                                Geoffery Lyons, M.D.    IL/MEDQ  D:  02/04/2002  T:  02/04/2002  Job:  [id number]

## 2010-07-22 NOTE — H&P (Signed)
NAME:  [name], [name]                         ACCOUNT NO.:  [id number]   MEDICAL RECORD NO.:  [id number]                   PATIENT TYPE:  IPS   LOCATION:  0502                                 [facility]ACILITY:  [facility]   PHYSICIAN:  Geoffery Lyons, M.D.                   DATE OF BIRTH:  [birth year]   DATE OF ADMISSION:  01/01/2002  DATE OF DISCHARGE:                         PSYCHIATRIC ADMISSION ASSESSMENT   IDENTIFYING INFORMATION:  A 50 year old married white female, voluntarily  admitted on January 01, 2002.   HISTORY OF PRESENT ILLNESS:  The patient presents with a history of anxiety  and depression, worrying about her son.  She states that she is not getting  any better with the medications.  She states her mother was sent here at  [facility] one week ago.  The patient is having problems with  initial and intermittent sleep, having a decreased appetite with a 10 pound  weight loss.  She denies any active suicidal thoughts but has thought about  lying in bed and not eating for several days.  Reports decreased  concentration which seems to be the main problem.  She is afraid that she  will say something stupid while talking to others.  Denies any psychotic  symptoms, no homicidal ideation, has a history of depression since 1984.   PAST PSYCHIATRIC HISTORY:  First hospitalization at [facility], was hospitalized last at [facility] and was at [facility] for depression 6 years ago.  Sees Dr. Mady Haagensen at [facility], last office visit was recently but she could not  remember.  She had an episode of not eating for 3 days 6 years ago.   SOCIAL HISTORY:  She is a 50 year old married white female, married for 10  years, second marriage.  She has a 81 year old child, lives with her  husband, trying to file for disability for psychiatric illness, no legal  problems.   FAMILY HISTORY:  Believes her son has some kind of problems,  mother has  history of bipolar, and her sister.   ALCOHOL DRUG HISTORY:  Nonsmoker, denies any alcohol or substance abuse.   PAST MEDICAL HISTORY:  Primary care Jaiveon Suppes is Dr. Juleen China in [place], Dr.  Meryl Crutch at [facility] in [place] for her migraine  headaches.  Medical problems are migraine headaches.   MEDICATIONS:  The patient has been on Zomig, Prozac increased recently to 80  mg, Wellbutrin 150 mg with a recent increased.  Has been on both those  medications for years.  And Xanax, was taking 2 a day now increasing to 4 a  day for the past 2 or 3 weeks.   DRUG ALLERGIES:  SULFA.   PHYSICAL EXAMINATION:  GENERAL:  The patient reports a long history of  migraine headaches, no other cardiac or respiratory problems..  The patient  is 5 feet 6-1/2  inches tall, and 145 pounds.  Her vital signs:  97.1, 75  heart rate, 16 respirations, blood pressure is 133/93.  GENERAL APPEARANCE:  The patient is a 50 year old Caucasian female in no  acute distress, appears younger than her calendar years.  HEAD:  Normocephalic, atraumatic.  Her hair is blond, clean, and evenly  distributed.  EOMs are intact.  Tongue protrudes midline without tremor.  No  sinus tenderness or nasal discharge.  NECK:  Supple, no JVD, negative lymphadenopathy.  Thyroid is nonpalpable and  nontender.  CHEST:  Clear to auscultation.  No adventitious sounds.  HEART:  Regular rate and rhythm, without murmurs, gallops or rubs.  Carotid  pulses are equal and adequate.  BREAST EXAM:  Deferred.  ABDOMEN:  Soft, nontender abdomen.  No CVA tenderness.  Lumbar is straight  and without tenderness.  MUSCULOSKELETAL:  Good range of motion.  5+ against resistance.  No signs of  injury.  No deformity.  SKIN:  Warm and dry.  Good capillary refill.  No rashes or lacerations were  noted.  NEUROLOGIC:  Oriented x3.  No tics or tremors are noted.  Cranial nerves are  grossly intact.  Able to perform heel to shin  and normal alternating  movements.   LABORATORY DATA:  CBC and CMET are within normal limits.  TSH within normal  limits.   MENTAL STATUS EXAM:  Short, middle-aged, Caucasian female, cooperative, good  eye contact.  Speech is clear, mood is anxious, affect is mildly anxious.  Thought processes are coherent.  There is no evidence of psychosis,  expresses some passive suicidal thoughts.  No homicidal ideation.  No  auditory hallucinations.  Cognitive function intact.  Memory is fair,  decreased concentration, judgment is good, insight is fair.   ADMISSION DIAGNOSES:   AXIS I:  1. Major depression.  2. Anxiety disorder not otherwise specified.   AXIS II:  Deferred.   AXIS III:  Migraine headaches.   AXIS IV:  Problems with primary support group, other psychosocial problems.   AXIS V:  Current is 35, this past year 18-70.   PLAN:  Voluntary admission to [facility] for depression and  suicidal thoughts.  Contract for safety, check every 15 minutes.  Will  resume her medications, will check labs.  Will d/c the Ambien, have  trazodone available for sleep to ensure sleep.  Will increase her Prozac to  patient's latest dose of 80 mg.  The patient to follow up with Dr. Calton Dach.  Stabilize her mood and thinking so the patient can be safe.   TENTATIVE LENGTH OF CARE:  3-4 days.         Landry Corporal, N.P.                       Geoffery Lyons, M.D.    JO/MEDQ  D:  01/03/2002  T:  01/04/2002  Job:  [id number]

## 2010-07-22 NOTE — H&P (Signed)
NAME:  [name], [name]                         ACCOUNT NO.:  [id number]   MEDICAL RECORD NO.:  [id number]                   PATIENT TYPE:  IPS   LOCATION:  0506                                 FACILITY:  [facility]   PHYSICIAN:  Hipolito Bayley, M.D.               DATE OF BIRTH:  1961-01-11   DATE OF ADMISSION:  01/15/2002  DATE OF DISCHARGE:                         PSYCHIATRIC ADMISSION ASSESSMENT   IDENTIFYING INFORMATION:  A 50 year old married white female, voluntarily  admitted on January 15, 2002.   HISTORY OF PRESENT ILLNESS:  The patient presents with a history of  depression, suicidal thoughts to overdose.  The patient was recently  discharged from [facility].  On Sunday she states she was raking  leaves, felt very physically and emotionally worn out.  She was crying,  having suicidal thoughts to overdose on her Xanax, unable to contract for  safety.  She states her stressors are her son who has a history of drug use.  She is also filing for bankruptcy.  The patient  has over-extended her  credit cards  She feels she has caused so many problems in the family, she  feels very anxious, more so than feelings of depression.  Denies any current  suicidal or homicidal ideation or psychosis.  The patient's mother is in the  interview per patient's request.   PAST PSYCHIATRIC HISTORY:  Second hospitalization at [facility], here last Wednesday prior to this admission.  No history of a  suicide attempt.  Sees Dr. Radene Ou as an outpatient.   SOCIAL HISTORY:  She is a 50 year old married white female, married for 11  years, has a 28 year old child.  Lives with her husband, filing for  disability.  No legal charges, completed the 12th grade.   FAMILY HISTORY:  Unclear.   ALCOHOL DRUG HISTORY:  Nonsmoker, denies any alcohol or substance abuse.   PAST MEDICAL HISTORY:  Primary care Shahana Capes is Dr. Juleen China.  Medical problems  are headaches.   MEDICATIONS:   Wellbutrin SR 150 mg q.d., Prozac 40 mg, Buprenex 0.4 mg  q.i.d., Provigil 200 mg p.o. q.a.m. and trazodone for sleep.   DRUG ALLERGIES:  SULFA, the patient states she gets nauseated.   PHYSICAL EXAMINATION:  Patient's vital signs 96.6, 96 heart rate, 20  respirations,blood pressure is 153/65.  The patient is 5 feet 6-1/2 inches  tall.  She appears in no acute distress.  Physical examination was performed  at last hospitalization with no significant findings.   MENTAL STATUS EXAM:  She is an alert, casually dressed, cooperative female  with good eye contact.  Speech is clear, mood is depressed and anxious.  The  patient appears depressed and anxious.  Thought processes are coherent and  goal directed, no psychotic symptoms noted.  Cognitive function intact.  Judgment and insight fair, memory is fair, concentration is decreased.   ADMISSION DIAGNOSES:   AXIS I:  1. Major  depression, recurrent, severe.  2. Rule out bipolar disorder II.   AXIS II:  Deferred.   AXIS III:  None.   AXIS IV:  Problems with primary support group, economics, other psychosocial  problems.   AXIS V:  Current is 30, this past year 15.   PLAN:  Voluntary admission to [facility] for depression and  suicidal ideation and anxiety.  Contract for safety, check every 15 minutes.  We will resume her medications.  Will have Darvocet available for the next  24 hours for complaints of headache.  Will stabilize her mood and thinking  so the patient can be safe and functional, have family session prior to  discharge, follow up with Dr. Radene Ou and her therapist.   TENTATIVE LENGTH OF CARE:  3-4 days.      Landry Corporal, N.P.                       Hipolito Bayley, M.D.    JO/MEDQ  D:  01/17/2002  T:  01/17/2002  Job:  [id number]

## 2010-07-22 NOTE — Op Note (Signed)
NAME:  [name], [name]                         ACCOUNT NO.:  [id number]   MEDICAL RECORD NO.:  [id number]                   PATIENT TYPE:  AMB   LOCATION:  NESC                                 FACILITY:  [facility]   PHYSICIAN:  Claudette Laws, M.D.               DATE OF BIRTH:  [birth year]   DATE OF PROCEDURE:  03/20/2002  DATE OF DISCHARGE:                                 OPERATIVE REPORT   PREOPERATIVE DIAGNOSES:  A 7 x 4 mm distal left ureteral calculus with renal  colic.   POSTOPERATIVE DIAGNOSES:  A 7 x 4 mm distal left ureteral calculus with  renal colic.   OPERATION:  Cystoscopy, left ureteroscopy and holmium laser lithotripsy,  left ureteral calculus and insertion of a double J stent.   SURGEON:  Claudette Laws, M.D.   HISTORY:  This is a 50 year old lady who presented to my office this week  with what appeared to be about a 7 x 4 mm distal left ureteral calculus with  intermittent renal colic. She had had a CT scan in [place] at [facility] a few days prior. She came into my office for treatment options.  The patient was having enough pain that she thought she would like to go  ahead with removal of the stone. I gave her treatment options and I thought  the best success would be with ureteroscopy and either a stone basket or  holmium laser procedure. I carefully explained the operation to her  including postop complications such as inadvertant perforation of the  ureter, a postop ureteral stricture, inability to get to the stone and I  told her that she would have a double J stent to put in postop. She was sent  only the appropriate literature and came in today as an outpatient for the  proposed procedure.   DESCRIPTION OF PROCEDURE:  The patient was prepped and draped in the dorsal  lithotomy position under LMA anesthesia. A B&O suppository was placed per  rectum for anesthetic purposes.   Cystoscopy was performed with a 20 French cystoscope. She had a  normal  bladder, no tumors, no calculi. There was some edema of the left ureteral  orifice. Initially using a 6 [place] open ended ureteral catheter, I was able  to pass up a 0.038 Glidewire, there was some resistance at the stone but  finally it went up into the ureter and using fluoroscopic control it was  curled up in the left kidney. I then removed the cystoscope over the  Glidewire and then put in a 6 1/2 [place] short rigid ureteroscope and under  direct vision intubated the left ureteral orifice. However, just distal to  the stone there was a concentric narrowing, some edema inflammation probably  irritation from the stone. It took several minutes but finally I was able to  negotiate my way past this area, encounter the stone and then  using a  holmium laser fiber broke up the stone with lithotripsy at 0.6 setting. The  148 pulses were used. The stone broke up nicely into fine gravel. At this  point, I backed out the ureteroscope and then back loaded the guidewire over  the cystoscope and then passed up a 6 French 26 cm double J stent. It was  positioned in the left kidney. The distal end was curled up in the bladder,  the bladder was emptied, all instruments were removed and the patient was  taken back to the recovery room in satisfactory condition. I plan to keep  the double J stent in now for several days and then remove it in the office.                                              Claudette Laws, M.D.     RFS/MEDQ  D:  03/20/2002  T:  03/20/2002  Job:  [id number]

## 2010-07-22 NOTE — Discharge Summary (Signed)
NAME:  ROSALEA, [name]                         ACCOUNT NO.:  [id number]   MEDICAL RECORD NO.:  [id number]                   PATIENT TYPE:  IPS   LOCATION:  0506                                 FACILITY:  [facility]   PHYSICIAN:  Geoffery Lyons, M.D.                   DATE OF BIRTH:  1960/11/15   DATE OF ADMISSION:  01/15/2002  DATE OF DISCHARGE:  01/20/2002                                 DISCHARGE SUMMARY   CHIEF COMPLAINT AND PRESENT ILLNESS:  This was the second admission to [facility] for this 50 year old married white female  voluntarily admitted.  She had a history of depression, thoughts to  overdose.  She was recently discharged from [facility].  She  was raking leaves, felt very physically and emotionally worn out, crying,  having suicidal thoughts to overdose on her Xanax.  She was unable to  contract for safety.  Her stressors are that her son, who had a history of  drug use, was filing for bankruptcy that would extend her credit card.  She  felt like she caused so many problems to her family that she would rather  die.   PAST PSYCHIATRIC HISTORY:  This was the second time at [facility].  Hinton Lovely, M.D., on an outpatient basis.   SUBSTANCE ABUSE HISTORY:  She denied the use or abuse of any substances.   MEDICATIONS:  1. Wellbutrin SR 150 mg every day.  2. Prozac 20 mg per day.  3. ___________ 0.4 mg four times a day.  4. Provigil 200 mg in the morning.  5. Trazodone for sleep.   PHYSICAL EXAMINATION:  Physical examination was performed, failed to show  any acute findings.   MENTAL STATUS EXAM:  Mental status exam revealed an alert, casually dressed,  cooperative female, good eye contact.  Speech was clear.  Mood was depressed  and anxious.  Affect was depressed and anxious.  Thought processes:  Coherent, goal directed; no psychotic symptoms.  Cognitive: Cognition was  well preserved.   ADMISSION  DIAGNOSES:   AXIS I:  1. Major depression, recurrent.  2. Anxiety disorder, not otherwise specified.   AXIS II:  No diagnosis.   AXIS III:  No diagnosis.   AXIS IV:  Moderate.   AXIS V:  Global assessment of functioning upon admission 30, highest global  assessment of functioning in the last year 65.   HOSPITAL COURSE:  She was admitted and started in intensive individual and  group psychotherapy.  We resumed working with her coping skills.  She was  given Xanax 0.5 mg four times a day, Prozac 40 mg per day.  Eventually, the  Xanax was decreased to 0.25 mg four times a day and the trazodone was  decreased to 50 mg.  She was started on Neurontin 100 mg that was increased  up to 100 mg  twice a day and 300 mg at night and the Xanax was decreased to  0.25 mg three times a day.  As the hospitalization progressed, she was able  to work on Pharmacologist, in stress management.  By November 17, she was  feeling much better, no suicidal ideas, no homicidal ideas, felt like her  cognitive function was slowly coming back.  She felt that she could safely  leave the hospital and continue to work on an outpatient basis.   DISCHARGE DIAGNOSES:   AXIS I:  1. Major depression, recurrent.  2. Anxiety disorder, not otherwise specified.   AXIS II:  No diagnosis.   AXIS III:  No diagnosis.   AXIS IV:  Moderate.   AXIS V:  Global assessment of functioning upon discharge 55-60.   DISCHARGE MEDICATIONS:  1. Provigil 200 mg in the morning.  2. Wellbutrin SR 150 mg twice a day.  3. Prozac 40 mg per day.  4. Xanax 0.5 mg three times a day.  5. Neurontin 100 mg twice a day and 300 mg at night.  6. Trazodone 50 mg at bedtime as needed for sleep.   FOLLOW UP:  She was to follow up with Hinton Lovely, M.D.                                                 Geoffery Lyons, M.D.    IL/MEDQ  D:  02/19/2002  T:  02/20/2002  Job:  [id number]

## 2010-09-12 ENCOUNTER — Other Ambulatory Visit: Payer: Self-pay

## 2010-09-12 MED ORDER — ESOMEPRAZOLE MAGNESIUM 40 MG PO CPDR: 40.0000 mg | DELAYED_RELEASE_CAPSULE | Freq: Two times a day (BID) | ORAL | Status: DC

## 2010-09-12 NOTE — Telephone Encounter (Signed)
Prescription refilled.

## 2010-09-23 ENCOUNTER — Telehealth: Payer: Self-pay | Admitting: Gastroenterology

## 2010-09-23 NOTE — Telephone Encounter (Signed)
Stated she is doing a little better on Nexium BID. Mailed her a GE reflux diet to her home per her request.

## 2010-11-24 LAB — POCT CARDIAC MARKERS
CKMB, poc: 2.7
CKMB, poc: 3.5
Myoglobin, poc: 131
Operator id: 4531
Operator id: 4531
Operator id: 4708
Troponin i, poc: <0.05

## 2010-11-24 LAB — COMPREHENSIVE METABOLIC PANEL
BUN: 15
CO2: 27
Calcium: 10.3
Creatinine, Ser: 1.01
GFR calc Af Amer: >60
GFR calc non Af Amer: 59 — ABNORMAL LOW
Glucose, Bld: 105 — ABNORMAL HIGH

## 2010-11-24 LAB — CBC
Hemoglobin: 14.2
MCHC: 35.2
MCHC: 33.9
MCV: 87
MCV: 89.4
Platelets: 295
RBC: 4.65
RDW: 13

## 2010-11-24 LAB — DIFFERENTIAL
Basophils Relative: 0
Lymphocytes Relative: 11 — ABNORMAL LOW
Monocytes Relative: 8
Neutro Abs: 8.8 — ABNORMAL HIGH
Neutrophils Relative %: 80 — ABNORMAL HIGH

## 2010-11-24 LAB — D-DIMER, QUANTITATIVE: D-Dimer, Quant: 0.27

## 2010-11-24 LAB — CULTURE, BLOOD (ROUTINE X 2): Culture: NO GROWTH

## 2010-11-24 LAB — CARDIAC PANEL(CRET KIN+CKTOT+MB+TROPI)
Total CK: 164
Troponin I: <0.01

## 2010-11-24 LAB — BASIC METABOLIC PANEL
BUN: 12
CO2: 27
Calcium: 9.2
Creatinine, Ser: 0.95
GFR calc Af Amer: >60
Glucose, Bld: 106 — ABNORMAL HIGH

## 2010-12-01 LAB — BASIC METABOLIC PANEL
BUN: 11
BUN: 14
Chloride: 101
Chloride: 101
Creatinine, Ser: 0.89
GFR calc non Af Amer: >60
Glucose, Bld: 103 — ABNORMAL HIGH
Glucose, Bld: 131 — ABNORMAL HIGH
Potassium: 3.3 — ABNORMAL LOW
Potassium: 3.4 — ABNORMAL LOW
Sodium: 137

## 2010-12-01 LAB — CBC
HCT: 37.8
HCT: 40.4
Hemoglobin: 13
MCV: 91
MCV: 91.6
Platelets: 289
Platelets: 295
RDW: 12.7
RDW: 12.9
WBC: 6.1

## 2010-12-01 LAB — PROTIME-INR: Prothrombin Time: 12.2

## 2011-05-11 ENCOUNTER — Other Ambulatory Visit: Payer: Self-pay | Admitting: Obstetrics and Gynecology

## 2012-06-06 ENCOUNTER — Other Ambulatory Visit: Payer: Self-pay | Admitting: Obstetrics and Gynecology

## 2013-04-12 ENCOUNTER — Encounter (HOSPITAL_COMMUNITY): Payer: Self-pay | Admitting: Emergency Medicine

## 2013-04-12 ENCOUNTER — Emergency Department (HOSPITAL_COMMUNITY)
Admission: EM | Admit: 2013-04-12 | Discharge: 2013-04-12 | Disposition: A | Discharge status: Home / Self Care | Payer: Medicare Other | Source: Home / Self Care | Attending: Family Medicine | Admitting: Family Medicine

## 2013-04-12 DIAGNOSIS — J069 Acute upper respiratory infection, unspecified: Secondary | ICD-10-CM

## 2013-04-12 DIAGNOSIS — S76011A Strain of muscle, fascia and tendon of right hip, initial encounter: Secondary | ICD-10-CM

## 2013-04-12 DIAGNOSIS — IMO0002 Reserved for concepts with insufficient information to code with codable children: Secondary | ICD-10-CM

## 2013-04-12 MED ORDER — FLUTICASONE PROPIONATE 50 MCG/ACT NA SUSP: 2.0000 | Freq: Every day | NASAL | Status: DC

## 2013-04-12 NOTE — ED Provider Notes (Signed)
CSN: [id number]     Arrival date & time 04/12/13  1002 History   First MD Initiated Contact with Patient 04/12/13 1127     Chief Complaint  Patient presents with  . URI   (Consider location/radiation/quality/duration/timing/severity/associated sxs/prior Treatment) HPI Comments: Pt worked with a Psychologist, educational for the first time 2/2. Woke up 2/3 with sore L gluteal area.  Has been icing it and doing stretching exercises to the point of severe pain to try and help it heal. Feels it is getting worse and spreading down her thigh to her knee now.   Patient is a 53 y.o. female presenting with URI and musculoskeletal pain. The history is provided by the patient.  URI Presenting symptoms: congestion and sore throat   Presenting symptoms: no cough, no ear pain, no facial pain, no fever and no rhinorrhea   Severity:  Moderate Onset quality:  Gradual Duration:  5 days Timing:  Constant Progression:  Unchanged Chronicity:  New Relieved by:  Nothing Worsened by:  Nothing tried Ineffective treatments: mucinex. Associated symptoms: no sinus pain   Muscle Pain This is a new problem. The problem occurs constantly. The problem has been gradually worsening. The symptoms are aggravated by walking (stretching). Nothing relieves the symptoms. She has tried a cold compress (ibuprofen) for the symptoms. The treatment provided no relief.    Past Medical History  Diagnosis Date  . DVT (deep venous thrombosis)   . Depression   . Migraines   . Chronic back pain   . Left ureteral calculus   . Anxiety   . Hypertension   . Internal hemorrhoids   . Fibromyalgia   . GERD (gastroesophageal reflux disease)   . Chronic constipation   . Vitamin D deficiency   . Hyperlipidemia   . Hayfever   . Anemia    Past Surgical History  Procedure Laterality Date  . Cesarean section  1984  . Partial hysterectomy  2001  . Bunionectomy  2009   Family History  Problem Relation Age of Onset  . Osteoarthritis Mother   .  Bipolar disorder Mother   . Hyperlipidemia Father   . Diabetes Maternal Grandmother   . Stroke Maternal Grandfather   . Colon cancer Neg Hx    History  Substance Use Topics  . Smoking status: Never Smoker   . Smokeless tobacco: Never Used  . Alcohol Use: No   OB History   Grav Para Term Preterm Abortions TAB SAB Ect Mult Living                 Review of Systems  Constitutional: Negative for fever and chills.  HENT: Positive for congestion, postnasal drip, sinus pressure and sore throat. Negative for ear pain and rhinorrhea.   Respiratory: Negative for cough.   Musculoskeletal:       L gluteal muscle pain    Allergies  Mirapex and Sulfa drugs cross reactors  Home Medications   Current Outpatient Rx  Name  Route  Sig  Dispense  Refill  . ALPRAZolam (XANAX) 1 MG tablet      1 tablet Three times a day.         Marland Kitchen buPROPion (WELLBUTRIN XL) 300 MG 24 hr tablet   Oral   Take 300 mg by mouth daily.           . Cholecalciferol (VITAMIN D3) 2000 UNITS TABS   Oral   Take 1 tablet by mouth daily.           Marland Kitchen  EXPIRED: esomeprazole (NEXIUM) 40 MG capsule   Oral   Take 1 capsule (40 mg total) by mouth 2 (two) times daily.   60 capsule   11   . fluticasone (FLONASE) 50 MCG/ACT nasal spray   Each Nare   Place 2 sprays into both nostrils daily.   16 g   2   . gabapentin (NEURONTIN) 600 MG tablet   Oral   Take 600 mg by mouth 3 (three) times daily.           Marland Kitchen. lamoTRIgine (LAMICTAL) 100 MG tablet   Oral   Take 100 mg by mouth 2 (two) times daily.           Marland Kitchen. lubiprostone (AMITIZA) 8 MCG capsule   Oral   Take 16 mcg by mouth. 2 in the morning and 2 at night         . methylphenidate (RITALIN) 20 MG tablet      1 tablet Three times a day.         . mirtazapine (REMERON) 15 MG tablet   Oral   Take 7.5 mg by mouth at bedtime.           . Multiple Vitamin (MULTIVITAMIN) tablet   Oral   Take 1 tablet by mouth daily.           . NON FORMULARY        Allergy shot twice weekly          . Sennosides (SENNA LAX PO)   Oral   Take by mouth. 4 in the morning and 4 at night          . SUMAtriptan (IMITREX) 50 MG tablet   Oral   Take 50 mg by mouth as needed.           . topiramate (TOPAMAX) 50 MG tablet      50 mg. 2 tablets by mouth in the morning and 1 tablet in the evening.          . traZODone (DESYREL) 100 MG tablet   Oral   Take 100 mg by mouth at bedtime.           . triamterene-hydrochlorothiazide (MAXZIDE-25) 37.5-25 MG per tablet   Oral   Take 1 tablet by mouth daily.            BP 123/83  Pulse 87  Temp(Src) 98 F (36.7 C) (Oral)  Resp 18  SpO2 98% Physical Exam  Constitutional: She appears well-developed and well-nourished. No distress.  HENT:  Right Ear: Tympanic membrane, external ear and ear canal normal.  Left Ear: Tympanic membrane, external ear and ear canal normal.  Nose: No mucosal edema or rhinorrhea. Right sinus exhibits maxillary sinus tenderness and frontal sinus tenderness. Left sinus exhibits maxillary sinus tenderness and frontal sinus tenderness.  Mouth/Throat: Oropharynx is clear and moist and mucous membranes are normal.  Cardiovascular: Normal rate and regular rhythm.   Pulmonary/Chest: Effort normal and breath sounds normal.  Musculoskeletal:       Left knee: Normal.       Left upper leg: She exhibits tenderness. She exhibits no bony tenderness, no swelling and no deformity.       Legs: Lymphadenopathy:       Head (right side): No submental, no submandibular and no tonsillar adenopathy present.       Head (left side): No submental, no submandibular and no tonsillar adenopathy present.    She has no cervical adenopathy.  ED Course  Procedures (including critical care time) Labs Review Labs Reviewed - No data to display Imaging Review No results found.    MDM   1. URI (upper respiratory infection)   2. Muscle strain of right gluteal region   Pt requests refill  of flonase for uri sx.  Rx flonase 2 sprays each nostril daily #1 with 2 refills. Pt to do gentle stretching of strained muscle and try warm bath with epsom salt. Continue mucinex, drink plenty of fluids. Use neti pot for nasal congestion.      Cathlyn Parsons, NP 04/12/13 1146

## 2013-04-12 NOTE — Discharge Instructions (Signed)
Use lemon juice in your drink to help relieve your sore throat. Try soaking in a warm bath with epsom salt in it to help your pulled muscle.  Switch from icing your muscle to using warm compresses.  Try gentle gluteal stretches, don't force anything and if it's too painful, you are doing too much. Use your neti pot several times daily as needed to relieve your congestion/sinus pressure.   Do the stretches when your muscles are warm 2-3 times per day. Do each stretch on both sides. Do them each 10 times, holding each time for 30 seconds. When your muscle has healed, talk with your trainer about strengthening exercises.    Upper Respiratory Infection, Adult An upper respiratory infection (URI) is also sometimes known as the common cold. The upper respiratory tract includes the nose, sinuses, throat, trachea, and bronchi. Bronchi are the airways leading to the lungs. Most people improve within 1 week, but symptoms can last up to 2 weeks. A residual cough may last even longer.  CAUSES Many different viruses can infect the tissues lining the upper respiratory tract. The tissues become irritated and inflamed and often become very moist. Mucus production is also common. A cold is contagious. You can easily spread the virus to others by oral contact. This includes kissing, sharing a glass, coughing, or sneezing. Touching your mouth or nose and then touching a surface, which is then touched by another person, can also spread the virus. SYMPTOMS  Symptoms typically develop 1 to 3 days after you come in contact with a cold virus. Symptoms vary from person to person. They may include:  Runny nose.  Sneezing.  Nasal congestion.  Sinus irritation.  Sore throat.  Loss of voice (laryngitis).  Cough.  Fatigue.  Muscle aches.  Loss of appetite.  Headache.  Low-grade fever. DIAGNOSIS  You might diagnose your own cold based on familiar symptoms, since most people get a cold 2 to 3 times a year. Your  caregiver can confirm this based on your exam. Most importantly, your caregiver can check that your symptoms are not due to another disease such as strep throat, sinusitis, pneumonia, asthma, or epiglottitis. Blood tests, throat tests, and X-rays are not necessary to diagnose a common cold, but they may sometimes be helpful in excluding other more serious diseases. Your caregiver will decide if any further tests are required. RISKS AND COMPLICATIONS  You may be at risk for a more severe case of the common cold if you smoke cigarettes, have chronic heart disease (such as heart failure) or lung disease (such as asthma), or if you have a weakened immune system. The very young and very old are also at risk for more serious infections. Bacterial sinusitis, middle ear infections, and bacterial pneumonia can complicate the common cold. The common cold can worsen asthma and chronic obstructive pulmonary disease (COPD). Sometimes, these complications can require emergency medical care and may be life-threatening. PREVENTION  The best way to protect against getting a cold is to practice good hygiene. Avoid oral or hand contact with people with cold symptoms. Wash your hands often if contact occurs. There is no clear evidence that vitamin C, vitamin E, echinacea, or exercise reduces the chance of developing a cold. However, it is always recommended to get plenty of rest and practice good nutrition. TREATMENT  Treatment is directed at relieving symptoms. There is no cure. Antibiotics are not effective, because the infection is caused by a virus, not by bacteria. Treatment may include:  Increased  fluid intake. Sports drinks offer valuable electrolytes, sugars, and fluids.  Breathing heated mist or steam (vaporizer or shower).  Eating chicken soup or other clear broths, and maintaining good nutrition.  Getting plenty of rest.  Using gargles or lozenges for comfort.  Controlling fevers with ibuprofen or  acetaminophen as directed by your caregiver.  Increasing usage of your inhaler if you have asthma. Zinc gel and zinc lozenges, taken in the first 24 hours of the common cold, can shorten the duration and lessen the severity of symptoms. Pain medicines may help with fever, muscle aches, and throat pain. A variety of non-prescription medicines are available to treat congestion and runny nose. Your caregiver can make recommendations and may suggest nasal or lung inhalers for other symptoms.  HOME CARE INSTRUCTIONS   Only take over-the-counter or prescription medicines for pain, discomfort, or fever as directed by your caregiver.  Use a warm mist humidifier or inhale steam from a shower to increase air moisture. This may keep secretions moist and make it easier to breathe.  Drink enough water and fluids to keep your urine clear or pale yellow.  Rest as needed.  Return to work when your temperature has returned to normal or as your caregiver advises. You may need to stay home longer to avoid infecting others. You can also use a face mask and careful hand washing to prevent spread of the virus. SEEK MEDICAL CARE IF:   After the first few days, you feel you are getting worse rather than better.  You need your caregiver's advice about medicines to control symptoms.  You develop chills, worsening shortness of breath, or brown or red sputum. These may be signs of pneumonia.  You develop yellow or brown nasal discharge or pain in the face, especially when you bend forward. These may be signs of sinusitis.  You develop a fever, swollen neck glands, pain with swallowing, or white areas in the back of your throat. These may be signs of strep throat. SEEK IMMEDIATE MEDICAL CARE IF:   You have a fever.  You develop severe or persistent headache, ear pain, sinus pain, or chest pain.  You develop wheezing, a prolonged cough, cough up blood, or have a change in your usual mucus (if you have chronic lung  disease).  You develop sore muscles or a stiff neck. Document Released: 08/16/2000 Document Revised: 05/15/2011 Document Reviewed: 06/24/2010 [facility] Patient Information 2014 [place], [place].  EXERCISES  RANGE OF MOTION (ROM) AND STRETCHING EXERCISES - Gluteus Medius Syndrome These exercises may help you when beginning to rehabilitate your injury. Your symptoms may go away with or without further involvement from your physician, physical therapist or athletic trainer. While completing these exercises, remember:   Restoring tissue flexibility helps normal motion to return to the joints. This allows healthier, less painful movement and activity.  An effective stretch should be held for at least 30 seconds.  A stretch should never be painful. You should only feel a gentle lengthening or release in the stretched tissue. STRETCH - Hip Rotators   Lie on your back on a firm surface. Grasp your right / left knee with your right / left hand and your ankle with your opposite hand.  Keeping your hips and shoulders firmly planted, gently pull your right / left knee, and rotate your lower leg toward your opposite shoulder until you feel a stretch in your buttocks.  Hold this stretch for __________ seconds. Repeat this stretch __________ times. Complete this stretch __________ times per  day. STRETCH  Iliotibial Band  On the floor or bed, lie on your side so your right / left leg is on top. Bend your knee and grab your ankle.  Slowly bring your knee back, so that your thigh is in line with your trunk. Keep your heel at your buttocks and gently arch your back so your head, shoulders and hips line up.  Slowly lower your leg so that your knee approaches the floor or bed, until you feel a gentle stretch on the outside of your right / left thigh. If you do not feel a stretch and your knee will not fall farther, place the heel of your opposite foot on top of your knee and pull your thigh down  farther.  Hold this stretch for __________ seconds. Repeat __________ times. Complete __________ times per day.   Document Released: 02/20/2005 Document Revised: 05/15/2011 Document Reviewed: 06/04/2008 [facility] Patient Information 2014 [place], [place].

## 2013-04-12 NOTE — ED Notes (Signed)
Onset Monday of sore throat, sinus drainage, sinus stuffiness, and dull forehead headache.  Unknown if running any fever.

## 2013-04-12 NOTE — ED Provider Notes (Signed)
Medical screening examination/treatment/procedure(s) were performed by a resident physician or non-physician practitioner and as the supervising physician I was immediately available for consultation/collaboration.  Orena Cavazos, MD    Shelba Susi S Megha Agnes, MD 04/12/13 2025 

## 2013-04-15 ENCOUNTER — Emergency Department (HOSPITAL_COMMUNITY)
Admission: EM | Admit: 2013-04-15 | Discharge: 2013-04-15 | Disposition: A | Discharge status: Home / Self Care | Payer: Medicare Other | Source: Home / Self Care | Attending: Family Medicine | Admitting: Family Medicine

## 2013-04-15 ENCOUNTER — Encounter (HOSPITAL_COMMUNITY): Payer: Self-pay | Admitting: Emergency Medicine

## 2013-04-15 DIAGNOSIS — J0100 Acute maxillary sinusitis, unspecified: Secondary | ICD-10-CM

## 2013-04-15 DIAGNOSIS — M76899 Other specified enthesopathies of unspecified lower limb, excluding foot: Secondary | ICD-10-CM

## 2013-04-15 DIAGNOSIS — M7072 Other bursitis of hip, left hip: Secondary | ICD-10-CM

## 2013-04-15 MED ORDER — IPRATROPIUM BROMIDE 0.06 % NA SOLN: 2.0000 | Freq: Four times a day (QID) | NASAL | Status: DC

## 2013-04-15 MED ORDER — DOXYCYCLINE HYCLATE 100 MG PO CAPS: 100.0000 mg | ORAL_CAPSULE | Freq: Two times a day (BID) | ORAL | Status: DC

## 2013-04-15 MED ORDER — DICLOFENAC POTASSIUM 50 MG PO TABS: 50.0000 mg | ORAL_TABLET | Freq: Three times a day (TID) | ORAL | Status: DC

## 2013-04-15 NOTE — ED Provider Notes (Signed)
CSN: [id number]     Arrival date & time 04/15/13  [id number] History   First MD Initiated Contact with Patient 04/15/13 1035     Chief Complaint  Patient presents with  . URI     (Consider location/radiation/quality/duration/timing/severity/associated sxs/prior Treatment) Patient is a 53 y.o. female presenting with URI and leg pain. The history is provided by the patient.  URI Presenting symptoms: congestion, cough, facial pain and rhinorrhea   Presenting symptoms: no fever   Severity:  Moderate Onset quality:  Gradual Duration:  1 week Progression:  Worsening Chronicity:  New Ineffective treatments: [facility] ineffective. Associated symptoms: myalgias   Leg Pain Location:  Hip and knee Injury: no   Hip location:  L hip Knee location:  L knee Pain details:    Quality:  Burning   Severity:  Mild   Onset quality:  Gradual   Duration:  1 week Chronicity:  New (onset after doing exercise program.) Associated symptoms: no back pain and no fever     Past Medical History  Diagnosis Date  . DVT (deep venous thrombosis)   . Depression   . Migraines   . Chronic back pain   . Left ureteral calculus   . Anxiety   . Hypertension   . Internal hemorrhoids   . Fibromyalgia   . GERD (gastroesophageal reflux disease)   . Chronic constipation   . Vitamin D deficiency   . Hyperlipidemia   . Hayfever   . Anemia    Past Surgical History  Procedure Laterality Date  . Cesarean section  1984  . Partial hysterectomy  2001  . Bunionectomy  2009   Family History  Problem Relation Age of Onset  . Osteoarthritis Mother   . Bipolar disorder Mother   . Hyperlipidemia Father   . Diabetes Maternal Grandmother   . Stroke Maternal Grandfather   . Colon cancer Neg Hx    History  Substance Use Topics  . Smoking status: Never Smoker   . Smokeless tobacco: Never Used  . Alcohol Use: No   OB History   Grav Para Term Preterm Abortions TAB SAB Ect Mult Living                  Review of Systems  Constitutional: Negative.  Negative for fever.  HENT: Positive for congestion, postnasal drip, rhinorrhea and sinus pressure.   Respiratory: Positive for cough.   Musculoskeletal: Positive for myalgias. Negative for back pain, gait problem and joint swelling.  Skin: Negative.       Allergies  Mirapex and Sulfa drugs cross reactors  Home Medications   Current Outpatient Rx  Name  Route  Sig  Dispense  Refill  . ALPRAZolam (XANAX) 1 MG tablet      1 tablet Three times a day.         Marland Kitchen. buPROPion (WELLBUTRIN XL) 300 MG 24 hr tablet   Oral   Take 300 mg by mouth daily.           . Cholecalciferol (VITAMIN D3) 2000 UNITS TABS   Oral   Take 1 tablet by mouth daily.           . diclofenac (CATAFLAM) 50 MG tablet   Oral   Take 1 tablet (50 mg total) by mouth 3 (three) times daily. For leg [pain   30 tablet   0   . doxycycline (VIBRAMYCIN) 100 MG capsule   Oral   Take 1 capsule (100 mg  total) by mouth 2 (two) times daily.   20 capsule   0   . EXPIRED: esomeprazole (NEXIUM) 40 MG capsule   Oral   Take 1 capsule (40 mg total) by mouth 2 (two) times daily.   60 capsule   11   . fluticasone (FLONASE) 50 MCG/ACT nasal spray   Each Nare   Place 2 sprays into both nostrils daily.   16 g   2   . gabapentin (NEURONTIN) 600 MG tablet   Oral   Take 600 mg by mouth 3 (three) times daily.           Marland Kitchen ipratropium (ATROVENT) 0.06 % nasal spray   Each Nare   Place 2 sprays into both nostrils 4 (four) times daily.   15 mL   1   . lamoTRIgine (LAMICTAL) 100 MG tablet   Oral   Take 100 mg by mouth 2 (two) times daily.           Marland Kitchen lubiprostone (AMITIZA) 8 MCG capsule   Oral   Take 16 mcg by mouth. 2 in the morning and 2 at night         . methylphenidate (RITALIN) 20 MG tablet      1 tablet Three times a day.         . mirtazapine (REMERON) 15 MG tablet   Oral   Take 7.5 mg by mouth at bedtime.           . Multiple Vitamin  (MULTIVITAMIN) tablet   Oral   Take 1 tablet by mouth daily.           . NON FORMULARY      Allergy shot twice weekly          . Sennosides (SENNA LAX PO)   Oral   Take by mouth. 4 in the morning and 4 at night          . SUMAtriptan (IMITREX) 50 MG tablet   Oral   Take 50 mg by mouth as needed.           . topiramate (TOPAMAX) 50 MG tablet      50 mg. 2 tablets by mouth in the morning and 1 tablet in the evening.          . traZODone (DESYREL) 100 MG tablet   Oral   Take 100 mg by mouth at bedtime.           . triamterene-hydrochlorothiazide (MAXZIDE-25) 37.5-25 MG per tablet   Oral   Take 1 tablet by mouth daily.            BP 114/77  Pulse 84  Temp(Src) 99.1 F (37.3 C) (Oral)  Resp 18  SpO2 100% Physical Exam  Nursing note and vitals reviewed. Constitutional: She is oriented to person, place, and time. She appears well-developed and well-nourished.  HENT:  Head: Normocephalic.  Right Ear: External ear normal.  Left Ear: External ear normal.  Nose: Nose normal.  Mouth/Throat: Oropharynx is clear and moist.  Eyes: Conjunctivae are normal. Pupils are equal, round, and reactive to light.  Neck: Normal range of motion. Neck supple.  Cardiovascular: Normal rate, normal heart sounds and intact distal pulses.   Pulmonary/Chest: Effort normal and breath sounds normal.  Abdominal: Soft. Bowel sounds are normal.  Musculoskeletal: She exhibits tenderness.  Lymphadenopathy:    She has no cervical adenopathy.  Neurological: She is alert and oriented to person, place, and time.  Skin: Skin is  warm and dry.    ED Course  Procedures (including critical care time) Labs Review Labs Reviewed - No data to display Imaging Review No results found.    MDM   Final diagnoses:  Sinusitis, acute maxillary  Bursitis of left hip       Linna Hoff, MD 04/15/13 1057

## 2013-04-15 NOTE — ED Notes (Signed)
Congested, blowing green.  Left knee burning, constant.  Feels " like bees stinging it" continued numbness.  Patient seen over the weekend for the same--2/7

## 2013-11-18 ENCOUNTER — Other Ambulatory Visit: Payer: Self-pay | Admitting: Dermatology

## 2014-11-17 ENCOUNTER — Telehealth: Payer: Self-pay | Admitting: *Deleted

## 2014-11-17 ENCOUNTER — Encounter: Payer: Self-pay | Admitting: *Deleted

## 2014-11-17 NOTE — Telephone Encounter (Signed)
Pt spoke with [facility] scheduler request note stating she has metal in her right foot from previous foot surgery in 2010.  Letter typed, Marylene Land to call for pt to pick up.

## 2015-06-23 ENCOUNTER — Encounter (HOSPITAL_COMMUNITY): Payer: Self-pay | Admitting: Emergency Medicine

## 2015-06-23 ENCOUNTER — Emergency Department (HOSPITAL_COMMUNITY): Payer: Medicare Other

## 2015-06-23 ENCOUNTER — Emergency Department (HOSPITAL_COMMUNITY)
Admission: EM | Admit: 2015-06-23 | Discharge: 2015-06-23 | Disposition: A | Discharge status: Home / Self Care | Payer: Medicare Other | Attending: Emergency Medicine | Admitting: Emergency Medicine

## 2015-06-23 DIAGNOSIS — Z8709 Personal history of other diseases of the respiratory system: Secondary | ICD-10-CM | POA: Diagnosis not present

## 2015-06-23 DIAGNOSIS — K5900 Constipation, unspecified: Secondary | ICD-10-CM | POA: Diagnosis not present

## 2015-06-23 DIAGNOSIS — E785 Hyperlipidemia, unspecified: Secondary | ICD-10-CM | POA: Insufficient documentation

## 2015-06-23 DIAGNOSIS — S0181XA Laceration without foreign body of other part of head, initial encounter: Secondary | ICD-10-CM | POA: Diagnosis not present

## 2015-06-23 DIAGNOSIS — Z7951 Long term (current) use of inhaled steroids: Secondary | ICD-10-CM | POA: Diagnosis not present

## 2015-06-23 DIAGNOSIS — G43909 Migraine, unspecified, not intractable, without status migrainosus: Secondary | ICD-10-CM | POA: Diagnosis not present

## 2015-06-23 DIAGNOSIS — F419 Anxiety disorder, unspecified: Secondary | ICD-10-CM | POA: Insufficient documentation

## 2015-06-23 DIAGNOSIS — S7001XA Contusion of right hip, initial encounter: Secondary | ICD-10-CM | POA: Insufficient documentation

## 2015-06-23 DIAGNOSIS — Z86718 Personal history of other venous thrombosis and embolism: Secondary | ICD-10-CM | POA: Insufficient documentation

## 2015-06-23 DIAGNOSIS — Z79899 Other long term (current) drug therapy: Secondary | ICD-10-CM | POA: Insufficient documentation

## 2015-06-23 DIAGNOSIS — S0993XA Unspecified injury of face, initial encounter: Secondary | ICD-10-CM | POA: Diagnosis present

## 2015-06-23 DIAGNOSIS — Y9289 Other specified places as the place of occurrence of the external cause: Secondary | ICD-10-CM | POA: Insufficient documentation

## 2015-06-23 DIAGNOSIS — K219 Gastro-esophageal reflux disease without esophagitis: Secondary | ICD-10-CM | POA: Diagnosis not present

## 2015-06-23 DIAGNOSIS — Y9389 Activity, other specified: Secondary | ICD-10-CM | POA: Diagnosis not present

## 2015-06-23 DIAGNOSIS — W19XXXA Unspecified fall, initial encounter: Secondary | ICD-10-CM

## 2015-06-23 DIAGNOSIS — Z87448 Personal history of other diseases of urinary system: Secondary | ICD-10-CM | POA: Insufficient documentation

## 2015-06-23 DIAGNOSIS — S199XXA Unspecified injury of neck, initial encounter: Secondary | ICD-10-CM | POA: Insufficient documentation

## 2015-06-23 DIAGNOSIS — M797 Fibromyalgia: Secondary | ICD-10-CM | POA: Insufficient documentation

## 2015-06-23 DIAGNOSIS — E559 Vitamin D deficiency, unspecified: Secondary | ICD-10-CM | POA: Insufficient documentation

## 2015-06-23 DIAGNOSIS — S4992XA Unspecified injury of left shoulder and upper arm, initial encounter: Secondary | ICD-10-CM | POA: Diagnosis not present

## 2015-06-23 DIAGNOSIS — G8929 Other chronic pain: Secondary | ICD-10-CM | POA: Diagnosis not present

## 2015-06-23 DIAGNOSIS — F329 Major depressive disorder, single episode, unspecified: Secondary | ICD-10-CM | POA: Diagnosis not present

## 2015-06-23 DIAGNOSIS — W108XXA Fall (on) (from) other stairs and steps, initial encounter: Secondary | ICD-10-CM | POA: Diagnosis not present

## 2015-06-23 DIAGNOSIS — Z880 Allergy status to penicillin: Secondary | ICD-10-CM | POA: Insufficient documentation

## 2015-06-23 DIAGNOSIS — S0990XA Unspecified injury of head, initial encounter: Secondary | ICD-10-CM

## 2015-06-23 DIAGNOSIS — Z862 Personal history of diseases of the blood and blood-forming organs and certain disorders involving the immune mechanism: Secondary | ICD-10-CM | POA: Insufficient documentation

## 2015-06-23 DIAGNOSIS — Y998 Other external cause status: Secondary | ICD-10-CM | POA: Insufficient documentation

## 2015-06-23 MED ORDER — LIDOCAINE-EPINEPHRINE 2 %-1:100000 IJ SOLN
1.7000 mL | Freq: Once | INTRAMUSCULAR | Status: AC
  Administered 2015-06-23: 1.7 mL via INTRADERMAL
  Filled 2015-06-23: qty 1

## 2015-06-23 MED ORDER — HYDROCODONE-ACETAMINOPHEN 5-325 MG PO TABS
1.0000 | ORAL_TABLET | ORAL | Status: DC | PRN
Start: 2015-06-23 — End: 2017-12-16

## 2015-06-23 MED ORDER — BACITRACIN ZINC 500 UNIT/GM EX OINT
TOPICAL_OINTMENT | Freq: Two times a day (BID) | CUTANEOUS | Status: DC
  Filled 2015-06-23: qty 0.9

## 2015-06-23 NOTE — ED Notes (Addendum)
Pt states she fell down four stairs in a building today. States she hit her head, lower chin is scraped, right hip pain, right shoulder pain. Denies taking blood thinners. No obvious injuries. Asking if we can take pictures of injuries to give to her lawyer. No gait abnormalities observed

## 2015-06-23 NOTE — Discharge Instructions (Signed)
You sustained a 1.5cm laceration to the chin. It was repaired in the emergency department with 3 sutures. The sutures need to be removed in 5-7 days. They can removed by your primary physician or an urgent care facility. You need to return immediately for any evidence of infection including redness, warmth, fever or purulent drainage. You also have a right hip hematoma roughly 15 cm in diameter. Keep ice over the site to help control swelling. Return immediately if the hematoma develops warmth, redness or you develop fever. These typically resolve without any intervention. You may take ibuprofen for moderate pain and hydrocodone as needed for breakthrough pain. Facial Laceration  A facial laceration is a cut on the face. These injuries can be painful and cause bleeding. Lacerations usually heal quickly, but they need special care to reduce scarring. DIAGNOSIS  Your health care provider will take a medical history, ask for details about how the injury occurred, and examine the wound to determine how deep the cut is. TREATMENT  Some facial lacerations may not require closure. Others may not be able to be closed because of an increased risk of infection. The risk of infection and the chance for successful closure will depend on various factors, including the amount of time since the injury occurred. The wound may be cleaned to help prevent infection. If closure is appropriate, pain medicines may be given if needed. Your health care provider will use stitches (sutures), wound glue (adhesive), or skin adhesive strips to repair the laceration. These tools bring the skin edges together to allow for faster healing and a better cosmetic outcome. If needed, you may also be given a tetanus shot. HOME CARE INSTRUCTIONS  Only take over-the-counter or prescription medicines as directed by your health care provider.  Follow your health care provider's instructions for wound care. These instructions will vary depending  on the technique used for closing the wound. For Sutures:  Keep the wound clean and dry.   If you were given a bandage (dressing), you should change it at least once a day. Also change the dressing if it becomes wet or dirty, or as directed by your health care provider.   Wash the wound with soap and water 2 times a day. Rinse the wound off with water to remove all soap. Pat the wound dry with a clean towel.   After cleaning, apply a thin layer of the antibiotic ointment recommended by your health care provider. This will help prevent infection and keep the dressing from sticking.   You may shower as usual after the first 24 hours. Do not soak the wound in water until the sutures are removed.   Get your sutures removed as directed by your health care provider. With facial lacerations, sutures should usually be taken out after 4-5 days to avoid stitch marks.   Wait a few days after your sutures are removed before applying any makeup. For Skin Adhesive Strips:  Keep the wound clean and dry.   Do not get the skin adhesive strips wet. You may bathe carefully, using caution to keep the wound dry.   If the wound gets wet, pat it dry with a clean towel.   Skin adhesive strips will fall off on their own. You may trim the strips as the wound heals. Do not remove skin adhesive strips that are still stuck to the wound. They will fall off in time.  For Wound Adhesive:  You may briefly wet your wound in the shower or bath.  Do not soak or scrub the wound. Do not swim. Avoid periods of heavy sweating until the skin adhesive has fallen off on its own. After showering or bathing, gently pat the wound dry with a clean towel.   Do not apply liquid medicine, cream medicine, ointment medicine, or makeup to your wound while the skin adhesive is in place. This may loosen the film before your wound is healed.   If a dressing is placed over the wound, be careful not to apply tape directly over the  skin adhesive. This may cause the adhesive to be pulled off before the wound is healed.   Avoid prolonged exposure to sunlight or tanning lamps while the skin adhesive is in place.  The skin adhesive will usually remain in place for 5-10 days, then naturally fall off the skin. Do not pick at the adhesive film.  After Healing: Once the wound has healed, cover the wound with sunscreen during the day for 1 full year. This can help minimize scarring. Exposure to ultraviolet light in the first year will darken the scar. It can take 1-2 years for the scar to lose its redness and to heal completely.  SEEK MEDICAL CARE IF:  You have a fever. SEEK IMMEDIATE MEDICAL CARE IF:  You have redness, pain, or swelling around the wound.   You see ayellowish-white fluid (pus) coming from the wound.    This information is not intended to replace advice given to you by your health care provider. Make sure you discuss any questions you have with your health care provider.   Document Released: 03/30/2004 Document Revised: 03/13/2014 Document Reviewed: 10/03/2012 Elsevier Interactive Patient Education 2016 [employer]  Head Injury, Adult You have a head injury. Headaches and throwing up (vomiting) are common after a head injury. It should be easy to wake up from sleeping. Sometimes you must stay in the hospital. Most problems happen within the first 24 hours. Side effects may occur up to 7-10 days after the injury.  WHAT ARE THE TYPES OF HEAD INJURIES? Head injuries can be as minor as a bump. Some head injuries can be more severe. More severe head injuries include:  A jarring injury to the brain (concussion).  A bruise of the brain (contusion). This mean there is bleeding in the brain that can cause swelling.  A cracked skull (skull fracture).  Bleeding in the brain that collects, clots, and forms a bump (hematoma). WHEN SHOULD I GET HELP RIGHT AWAY?   You are confused or sleepy.  You cannot be  woken up.  You feel sick to your stomach (nauseous) or keep throwing up (vomiting).  Your dizziness or unsteadiness is getting worse.  You have very bad, lasting headaches that are not helped by medicine. Take medicines only as told by your doctor.  You cannot use your arms or legs like normal.  You cannot walk.  You notice changes in the black spots in the center of the colored part of your eye (pupil).  You have clear or bloody fluid coming from your nose or ears.  You have trouble seeing. During the next 24 hours after the injury, you must stay with someone who can watch you. This person should get help right away (call 911 in the [place] if you start to shake and are not able to control it (have seizures), you pass out, or you are unable to wake up. HOW CAN I PREVENT A HEAD INJURY IN THE FUTURE?  Wear seat belts.  Wear a helmet while bike riding and playing sports like football.  Stay away from dangerous activities around the house. WHEN CAN I RETURN TO NORMAL ACTIVITIES AND ATHLETICS? See your doctor before doing these activities. You should not do normal activities or play contact sports until 1 week after the following symptoms have stopped:  Headache that does not go away.  Dizziness.  Poor attention.  Confusion.  Memory problems.  Sickness to your stomach or throwing up.  Tiredness.  Fussiness.  Bothered by bright lights or loud noises.  Anxiousness or depression.  Restless sleep. MAKE SURE YOU:   Understand these instructions.  Will watch your condition.  Will get help right away if you are not doing well or get worse.   This information is not intended to replace advice given to you by your health care provider. Make sure you discuss any questions you have with your health care provider.   Document Released: 02/03/2008 Document Revised: 03/13/2014 Document Reviewed: 10/28/2012 Elsevier Interactive Patient Education 2016 Else[employer] Inc[employer] Hematoma A  hematoma is a collection of blood under the skin, in an organ, in a body space, in a joint space, or in other tissue. The blood can clot to form a lump that you can see and feel. The lump is often firm and may sometimes become sore and tender. Most hematomas get better in a few days to weeks. However, some hematomas may be serious and require medical care. Hematomas can range in size from very small to very large. CAUSES  A hematoma can be caused by a blunt or penetrating injury. It can also be caused by spontaneous leakage from a blood vessel under the skin. Spontaneous leakage from a blood vessel is more likely to occur in older people, especially those taking blood thinners. Sometimes, a hematoma can develop after certain medical procedures. SIGNS AND SYMPTOMS   A firm lump on the body.  Possible pain and tenderness in the area.  Bruising.Blue, dark blue, purple-red, or yellowish skin may appear at the site of the hematoma if the hematoma is close to the surface of the skin. For hematomas in deeper tissues or body spaces, the signs and symptoms may be subtle. For example, an intra-abdominal hematoma may cause abdominal pain, weakness, fainting, and shortness of breath. An intracranial hematoma may cause a headache or symptoms such as weakness, trouble speaking, or a change in consciousness. DIAGNOSIS  A hematoma can usually be diagnosed based on your medical history and a physical exam. Imaging tests may be needed if your health care provider suspects a hematoma in deeper tissues or body spaces, such as the abdomen, head, or chest. These tests may include ultrasonography or a CT scan.  TREATMENT  Hematomas usually go away on their own over time. Rarely does the blood need to be drained out of the body. Large hematomas or those that may affect vital organs will sometimes need surgical drainage or monitoring. HOME CARE INSTRUCTIONS   Apply ice to the injured area:   Put ice in a plastic bag.    Place a towel between your skin and the bag.   Leave the ice on for 20 minutes, 2-3 times a day for the first 1 to 2 days.   After the first 2 days, switch to using warm compresses on the hematoma.   Elevate the injured area to help decrease pain and swelling. Wrapping the area with an elastic bandage may also be helpful. Compression helps to reduce swelling and promotes shrinking  of the hematoma. Make sure the bandage is not wrapped too tight.   If your hematoma is on a lower extremity and is painful, crutches may be helpful for a couple days.   Only take over-the-counter or prescription medicines as directed by your health care provider. SEEK IMMEDIATE MEDICAL CARE IF:   You have increasing pain, or your pain is not controlled with medicine.   You have a fever.   You have worsening swelling or discoloration.   Your skin over the hematoma breaks or starts bleeding.   Your hematoma is in your chest or abdomen and you have weakness, shortness of breath, or a change in consciousness.  Your hematoma is on your scalp (caused by a fall or injury) and you have a worsening headache or a change in alertness or consciousness. MAKE SURE YOU:   Understand these instructions.  Will watch your condition.  Will get help right away if you are not doing well or get worse.   This information is not intended to replace advice given to you by your health care provider. Make sure you discuss any questions you have with your health care provider.   Document Released: 10/05/2003 Document Revised: 10/23/2012 Document Reviewed: 07/31/2012 Elsevier Interactive Patient Education [employer]2016 [employer]

## 2015-06-23 NOTE — ED Provider Notes (Signed)
CSN: [id number]     Arrival date & time 06/23/15  1420 History   First MD Initiated Contact with Patient 06/23/15 1506     Chief Complaint  Patient presents with  . Fall  . Hip Pain  . Head Injury  . Shoulder Pain     (Consider location/radiation/quality/duration/timing/severity/associated sxs/prior Treatment) HPI Patient states that she lost her balance and fell down 4 stairs. This happened roughly 1/2 hours ago. No loss of consciousness. Patient states she did strike her chin, left shoulder and right hip. Patient is able to ambulate and denies any focal weakness or numbness. No nausea or vomiting. No visual changes. Tetanus update was given last year. Past Medical History  Diagnosis Date  . DVT (deep venous thrombosis) (HCC)   . Depression   . Migraines   . Chronic back pain   . Left ureteral calculus   . Anxiety   . Hypertension   . Internal hemorrhoids   . Fibromyalgia   . GERD (gastroesophageal reflux disease)   . Chronic constipation   . Vitamin D deficiency   . Hyperlipidemia   . Hayfever   . Anemia    Past Surgical History  Procedure Laterality Date  . Cesarean section  1984  . Partial hysterectomy  2001  . Bunionectomy  2009   Family History  Problem Relation Age of Onset  . Osteoarthritis Mother   . Bipolar disorder Mother   . Hyperlipidemia Father   . Diabetes Maternal Grandmother   . Stroke Maternal Grandfather   . Colon cancer Neg Hx    Social History  Substance Use Topics  . Smoking status: Never Smoker   . Smokeless tobacco: Never Used  . Alcohol Use: No   OB History    No data available     Review of Systems  Constitutional: Negative for fever and chills.  Eyes: Negative for visual disturbance.  Respiratory: Negative for shortness of breath.   Cardiovascular: Negative for chest pain.  Gastrointestinal: Negative for nausea, vomiting and abdominal pain.  Musculoskeletal: Positive for neck pain. Negative for myalgias, back pain and neck  stiffness.  Skin: Negative for rash.  Neurological: Negative for dizziness, syncope, weakness, light-headedness, numbness and headaches.  Psychiatric/Behavioral: The patient is nervous/anxious.   All other systems reviewed and are negative.     Allergies  Penicillins; Mirapex; and Sulfa drugs cross reactors  Home Medications   Prior to Admission medications   Medication Sig Start Date End Date Taking? Authorizing Provider  ALPRAZolam Prudy Feeler(XANAX) 1 MG tablet 1 tablet Three times a day. 06/14/10  Yes Historical Provider, MD  buPROPion (WELLBUTRIN XL) 300 MG 24 hr tablet Take 300 mg by mouth daily.     Yes Historical Provider, MD  Cholecalciferol (VITAMIN D3) 2000 UNITS TABS Take 1 tablet by mouth daily.     Yes Historical Provider, MD  fluticasone (FLONASE) 50 MCG/ACT nasal spray Place 2 sprays into both nostrils daily. Patient taking differently: Place 2 sprays into both nostrils daily as needed for allergies or rhinitis.  04/12/13  Yes Cathlyn ParsonsAngela M Kabbe, NP  gabapentin (NEURONTIN) 300 MG capsule Take 1,200 mg by mouth at bedtime. 06/05/15  Yes Historical Provider, MD  ibuprofen (ADVIL,MOTRIN) 600 MG tablet Take 600 mg by mouth 2 (two) times daily.   Yes Historical Provider, MD  lamoTRIgine (LAMICTAL) 100 MG tablet Take 100 mg by mouth 2 (two) times daily.     Yes Historical Provider, MD  methylphenidate (RITALIN) 20 MG tablet 20 mg every morning.  06/11/10  Yes Historical Provider, MD  Multiple Vitamin (MULTIVITAMIN) tablet Take 1 tablet by mouth daily.     Yes Historical Provider, MD  senna-docusate (SENNA PLUS) 8.6-50 MG tablet Take 2 tablets by mouth 2 (two) times daily.   Yes Historical Provider, MD  spironolactone (ALDACTONE) 50 MG tablet Take 50 mg by mouth daily. 06/02/15  Yes Historical Provider, MD  SUMAtriptan (IMITREX) 50 MG tablet Take 50 mg by mouth daily as needed for migraine.    Yes Historical Provider, MD  Tapentadol HCl (NUCYNTA) 100 MG TABS Take 100 mg by mouth 2 (two) times daily.    Yes Historical Provider, MD  traZODone (DESYREL) 100 MG tablet Take 100 mg by mouth at bedtime.     Yes Historical Provider, MD  diclofenac (CATAFLAM) 50 MG tablet Take 1 tablet (50 mg total) by mouth 3 (three) times daily. For leg [pain Patient not taking: Reported on 06/23/2015 04/15/13   Linna Hoff, MD  doxycycline (VIBRAMYCIN) 100 MG capsule Take 1 capsule (100 mg total) by mouth 2 (two) times daily. Patient not taking: Reported on 06/23/2015 04/15/13   Linna Hoff, MD  esomeprazole (NEXIUM) 40 MG capsule Take 1 capsule (40 mg total) by mouth 2 (two) times daily. 09/12/10 09/12/11  Meryl Dare, MD  HYDROcodone-acetaminophen (NORCO) 5-325 MG tablet Take 1-2 tablets by mouth every 4 (four) hours as needed for severe pain. 06/23/15   Loren Racer, MD  ipratropium (ATROVENT) 0.06 % nasal spray Place 2 sprays into both nostrils 4 (four) times daily. Patient not taking: Reported on 06/23/2015 04/15/13   Linna Hoff, MD   BP 149/101 mmHg  Pulse 88  Temp(Src) 98.2 F (36.8 C) (Oral)  Resp 16  SpO2 97% Physical Exam  Constitutional: She is oriented to person, place, and time. She appears well-developed and well-nourished. No distress.  Anxious appearing  HENT:  Head: Normocephalic.  Mouth/Throat: Oropharynx is clear and moist.  No obvious scalp hematoma, swelling or deformity. Midface is stable. No malocclusion. No evidence of skull fractures including hemotympanum, posterior auricular ecchymosis or periorbital ecchymosis.   Eyes: EOM are normal. Pupils are equal, round, and reactive to light.  No hyphema or pupil irregularity. No evidence of any globe trauma.  Neck: Normal range of motion. Neck supple.  Patient has no midline cervical tenderness to palpation. She has mild left paracervical tenderness. She has full range of motion of the neck  Cardiovascular: Normal rate and regular rhythm.  Exam reveals no gallop and no friction rub.   No murmur heard. Pulmonary/Chest: Effort normal and  breath sounds normal. No respiratory distress. She has no wheezes. She has no rales. She exhibits no tenderness.  No chest wall tenderness or evidence of trauma.  Abdominal: Soft. Bowel sounds are normal. She exhibits no distension and no mass. There is no tenderness. There is no rebound and no guarding.  Musculoskeletal: Normal range of motion. She exhibits no edema or tenderness.  Full range of motion of all joints. Left shoulder without swelling or deformity. Patient does have a large 15 cm in diameter hematoma over the lateral surface of the right hip with mild tenderness to palpation. She has full range of motion of the right hip. Pelvis is stable. 2+ distal pulses in all extremities.  Neurological: She is alert and oriented to person, place, and time.  Patient is alert and oriented x3 with clear, goal oriented speech. Patient has 5/5 motor in all extremities. Sensation is intact to light touch.  Patient has a normal gait and walks without assistance.  Skin: Skin is warm and dry. No rash noted. No erythema.  Patient has a 1 cm laceration just inferior to the chin. There is no active bleeding. Not grossly contaminated.  Psychiatric:  Anxious  Nursing note and vitals reviewed.   ED Course  .Marland KitchenLaceration Repair Date/Time: 06/23/2015 4:47 PM Performed by: Loren Racer Authorized by: Loren Racer Consent: Verbal consent obtained. Body area: head/neck Location details: chin Laceration length: 1.5 cm Foreign bodies: no foreign bodies Tendon involvement: none Nerve involvement: none Vascular damage: no Local anesthetic: lidocaine 2% with epinephrine Anesthetic total: 2 ml Patient sedated: no Preparation: Patient was prepped and draped in the usual sterile fashion. Irrigation solution: saline Irrigation method: syringe Amount of cleaning: standard Debridement: none Degree of undermining: none Skin closure: 5-0 Prolene Number of sutures: 3 Technique: simple Approximation:  close Approximation difficulty: simple Dressing: antibiotic ointment and 4x4 sterile gauze Patient tolerance: Patient tolerated the procedure well with no immediate complications   (including critical care time) Labs Review Labs Reviewed - No data to display  Imaging Review Dg Hip Unilat With Pelvis 2-3 Views Right  06/23/2015  CLINICAL DATA:  Status post fall down stairs today with onset of right hip pain. Initial encounter. EXAM: DG HIP (WITH OR WITHOUT PELVIS) 2-3V RIGHT COMPARISON:  None. FINDINGS: There is no evidence of hip fracture or dislocation. There is no evidence of arthropathy or other focal bone abnormality. There may be soft tissue swelling about the greater trochanter. IMPRESSION: Negative for bony or joint abnormality. Electronically Signed   By: Drusilla Kanner M.D.   On: 06/23/2015 16:12   I have personally reviewed and evaluated these images and lab results as part of my medical decision-making.   EKG Interpretation None      MDM   Final diagnoses:  Chin laceration, initial encounter  Closed head injury, initial encounter  Contusion of right hip, initial encounter   X-ray without any evidence of fracture. Patient instructed to keep ice on the right hip to control swelling. Laceration was repaired in emergency department. She understands need to follow-up with her primary physician in 5-7 days to have sutures removed. Chips and return immediately for any evidence of infection. She's also been given head injury precautions and voiced his understanding. Continues to have a normal neurologic exam and anxiety has improved.     Loren Racer, MD 06/23/15 1655

## 2015-06-23 NOTE — ED Notes (Signed)
Suture cart bedside. 

## 2015-07-17 ENCOUNTER — Encounter (HOSPITAL_BASED_OUTPATIENT_CLINIC_OR_DEPARTMENT_OTHER): Payer: Self-pay | Admitting: *Deleted

## 2015-07-17 ENCOUNTER — Emergency Department (HOSPITAL_BASED_OUTPATIENT_CLINIC_OR_DEPARTMENT_OTHER)
Admission: EM | Admit: 2015-07-17 | Discharge: 2015-07-17 | Disposition: A | Discharge status: Home / Self Care | Payer: Medicare Other | Attending: Emergency Medicine | Admitting: Emergency Medicine

## 2015-07-17 DIAGNOSIS — J0110 Acute frontal sinusitis, unspecified: Secondary | ICD-10-CM | POA: Insufficient documentation

## 2015-07-17 DIAGNOSIS — I10 Essential (primary) hypertension: Secondary | ICD-10-CM | POA: Insufficient documentation

## 2015-07-17 DIAGNOSIS — R0981 Nasal congestion: Secondary | ICD-10-CM | POA: Diagnosis present

## 2015-07-17 DIAGNOSIS — F329 Major depressive disorder, single episode, unspecified: Secondary | ICD-10-CM | POA: Insufficient documentation

## 2015-07-17 DIAGNOSIS — E785 Hyperlipidemia, unspecified: Secondary | ICD-10-CM | POA: Insufficient documentation

## 2015-07-17 LAB — RAPID STREP SCREEN (MED CTR MEBANE ONLY): Streptococcus, Group A Screen (Direct): NEGATIVE

## 2015-07-17 MED ORDER — AMOXICILLIN-POT CLAVULANATE 875-125 MG PO TABS: 1.0000 | ORAL_TABLET | Freq: Two times a day (BID) | ORAL | Status: DC

## 2015-07-17 NOTE — ED Notes (Addendum)
Reports congestion and cold sx, scratchy throat. Pt frequently clearing throat which she says is chronic. Also reports episode of chest tightness last evening which has mostly resolved

## 2015-07-17 NOTE — Discharge Instructions (Signed)

## 2015-07-17 NOTE — ED Provider Notes (Signed)
CSN: [id number]     Arrival date & time 07/17/15  1238 History   First MD Initiated Contact with Patient 07/17/15 1308     Chief Complaint  Patient presents with  . Nasal Congestion   HPI   55 year old female presents today with complaints of nasal congestion. Patient reports 7 days of sinus congestion, dry your tiff cough, rhinorrhea. Patient reports subjective intermittent fevers, no sick she's been using Claritin and Mucinex. Patient denies any significant past medical history of allergies or asthma. She denies any headache, neck stiffness, decreased by mouth intake. Patient denies productive cough, chest pain, shortness of breath. Patient reports nares are patent bilateral. Patient reports sinus pressure over the eyes and nose. No bloody or purulent discharge from the nose.   Past Medical History  Diagnosis Date  . DVT (deep venous thrombosis) (HCC)   . Depression   . Migraines   . Chronic back pain   . Left ureteral calculus   . Anxiety   . Hypertension   . Internal hemorrhoids   . Fibromyalgia   . GERD (gastroesophageal reflux disease)   . Chronic constipation   . Vitamin D deficiency   . Hyperlipidemia   . Hayfever   . Anemia    Past Surgical History  Procedure Laterality Date  . Cesarean section  1984  . Partial hysterectomy  2001  . Bunionectomy  2009   Family History  Problem Relation Age of Onset  . Osteoarthritis Mother   . Bipolar disorder Mother   . Hyperlipidemia Father   . Diabetes Maternal Grandmother   . Stroke Maternal Grandfather   . Colon cancer Neg Hx    Social History  Substance Use Topics  . Smoking status: Never Smoker   . Smokeless tobacco: Never Used  . Alcohol Use: No   OB History    No data available     Review of Systems  All other systems reviewed and are negative.   Allergies  Penicillins; Mirapex; and Sulfa drugs cross reactors  Home Medications   Prior to Admission medications   Medication Sig Start Date End Date  Taking? Authorizing Provider  ALPRAZolam Prudy Feeler) 1 MG tablet 1 tablet Three times a day. 06/14/10   Historical Provider, MD  amoxicillin-clavulanate (AUGMENTIN) 875-125 MG tablet Take 1 tablet by mouth every 12 (twelve) hours. 07/17/15   Eyvonne Mechanic, PA-C  buPROPion (WELLBUTRIN XL) 300 MG 24 hr tablet Take 300 mg by mouth daily.      Historical Provider, MD  Cholecalciferol (VITAMIN D3) 2000 UNITS TABS Take 1 tablet by mouth daily.      Historical Provider, MD  diclofenac (CATAFLAM) 50 MG tablet Take 1 tablet (50 mg total) by mouth 3 (three) times daily. For leg [pain Patient not taking: Reported on 06/23/2015 04/15/13   Linna Hoff, MD  doxycycline (VIBRAMYCIN) 100 MG capsule Take 1 capsule (100 mg total) by mouth 2 (two) times daily. Patient not taking: Reported on 06/23/2015 04/15/13   Linna Hoff, MD  esomeprazole (NEXIUM) 40 MG capsule Take 1 capsule (40 mg total) by mouth 2 (two) times daily. 09/12/10 09/12/11  Meryl Dare, MD  fluticasone (FLONASE) 50 MCG/ACT nasal spray Place 2 sprays into both nostrils daily. Patient taking differently: Place 2 sprays into both nostrils daily as needed for allergies or rhinitis.  04/12/13   Cathlyn Parsons, NP  gabapentin (NEURONTIN) 300 MG capsule Take 1,200 mg by mouth at bedtime. 06/05/15   Historical Provider, MD  HYDROcodone-acetaminophen [facility])  5-325 MG tablet Take 1-2 tablets by mouth every 4 (four) hours as needed for severe pain. 06/23/15   Loren Raceravid Yelverton, MD  ibuprofen (ADVIL,MOTRIN) 600 MG tablet Take 600 mg by mouth 2 (two) times daily.    Historical Provider, MD  ipratropium (ATROVENT) 0.06 % nasal spray Place 2 sprays into both nostrils 4 (four) times daily. Patient not taking: Reported on 06/23/2015 04/15/13   Linna HoffJames D Kindl, MD  lamoTRIgine (LAMICTAL) 100 MG tablet Take 100 mg by mouth 2 (two) times daily.      Historical Provider, MD  methylphenidate (RITALIN) 20 MG tablet 20 mg every morning.  06/11/10   Historical Provider, MD  Multiple Vitamin  (MULTIVITAMIN) tablet Take 1 tablet by mouth daily.      Historical Provider, MD  senna-docusate (SENNA PLUS) 8.6-50 MG tablet Take 2 tablets by mouth 2 (two) times daily.    Historical Provider, MD  spironolactone (ALDACTONE) 50 MG tablet Take 50 mg by mouth daily. 06/02/15   Historical Provider, MD  SUMAtriptan (IMITREX) 50 MG tablet Take 50 mg by mouth daily as needed for migraine.     Historical Provider, MD  Tapentadol HCl (NUCYNTA) 100 MG TABS Take 100 mg by mouth 2 (two) times daily.    Historical Provider, MD  traZODone (DESYREL) 100 MG tablet Take 100 mg by mouth at bedtime.      Historical Provider, MD   BP 125/82 mmHg  Pulse 80  Resp 18  SpO2 100% Physical Exam  Constitutional: She is oriented to person, place, and time. She appears well-developed and well-nourished.  HENT:  Head: Normocephalic and atraumatic.  Right Ear: Hearing and tympanic membrane normal.  Left Ear: Hearing and tympanic membrane normal.  Nose: Rhinorrhea present. Right sinus exhibits frontal sinus tenderness. Left sinus exhibits frontal sinus tenderness.  Mouth/Throat: Uvula is midline, oropharynx is clear and moist and mucous membranes are normal. No oropharyngeal exudate, posterior oropharyngeal edema, posterior oropharyngeal erythema or tonsillar abscesses.  Eyes: Conjunctivae are normal. Pupils are equal, round, and reactive to light. Right eye exhibits no discharge. Left eye exhibits no discharge. No scleral icterus.  Neck: Normal range of motion. No JVD present. No tracheal deviation present.  Pulmonary/Chest: Effort normal and breath sounds normal. No stridor. No respiratory distress. She has no wheezes. She has no rales. She exhibits no tenderness.  Neurological: She is alert and oriented to person, place, and time. Coordination normal.  Psychiatric: She has a normal mood and affect. Her behavior is normal. Judgment and thought content normal.  Nursing note and vitals reviewed.   ED Course   Procedures (including critical care time) Labs Review Labs Reviewed  RAPID STREP SCREEN (NOT AT [facility])  CULTURE, GROUP A STREP [facility])    Imaging Review No results found. I have personally reviewed and evaluated these images and lab results as part of my medical decision-making.   EKG Interpretation   Date/Time:  Saturday Jul 17 2015 13:04:43 EDT Ventricular Rate:  85 PR Interval:  127 QRS Duration: 97 QT Interval:  369 QTC Calculation: 439 R Axis:   115 Text Interpretation:  Right and left arm electrode reversal,  interpretation assumes no reversal Sinus rhythm Right axis deviation  Abnormal lateral Q waves Abnormal T, consider ischemia, lateral leads  Confirmed by BELFI  MD, MELANIE ([id number]) on 07/17/2015 2:30:47 PM      MDM   Final diagnoses:  Acute frontal sinusitis, recurrence not specified    Labs:  Imaging:  Consults:  Therapeutics:  Discharge  Meds:   Assessment/Plan: 55 year old female presents today with likely sinusitis. Patient's symptoms have been present for 7 days, she'll be treated for bacterial sinusitis, she is encouraged follow-up with primary care provider for evaluation and further management. Patient is given strict return precautions, verbalized understanding and agreement today's plan.         Eyvonne Mechanic, PA-C 07/18/15 [id number]  Rolan Bucco, MD 07/18/15 1055

## 2015-07-20 LAB — CULTURE, GROUP A STREP (THRC)

## 2016-03-20 ENCOUNTER — Encounter (HOSPITAL_BASED_OUTPATIENT_CLINIC_OR_DEPARTMENT_OTHER): Payer: Self-pay | Admitting: *Deleted

## 2016-03-20 ENCOUNTER — Emergency Department (HOSPITAL_BASED_OUTPATIENT_CLINIC_OR_DEPARTMENT_OTHER)
Admission: EM | Admit: 2016-03-20 | Discharge: 2016-03-20 | Disposition: A | Discharge status: Home / Self Care | Payer: Medicare Other | Attending: Emergency Medicine | Admitting: Emergency Medicine

## 2016-03-20 ENCOUNTER — Emergency Department (HOSPITAL_BASED_OUTPATIENT_CLINIC_OR_DEPARTMENT_OTHER): Payer: Medicare Other

## 2016-03-20 DIAGNOSIS — R05 Cough: Secondary | ICD-10-CM | POA: Insufficient documentation

## 2016-03-20 DIAGNOSIS — M791 Myalgia: Secondary | ICD-10-CM | POA: Diagnosis not present

## 2016-03-20 DIAGNOSIS — R0981 Nasal congestion: Secondary | ICD-10-CM | POA: Insufficient documentation

## 2016-03-20 DIAGNOSIS — R69 Illness, unspecified: Secondary | ICD-10-CM

## 2016-03-20 DIAGNOSIS — J3489 Other specified disorders of nose and nasal sinuses: Secondary | ICD-10-CM | POA: Diagnosis not present

## 2016-03-20 DIAGNOSIS — J111 Influenza due to unidentified influenza virus with other respiratory manifestations: Secondary | ICD-10-CM

## 2016-03-20 DIAGNOSIS — R5383 Other fatigue: Secondary | ICD-10-CM | POA: Insufficient documentation

## 2016-03-20 DIAGNOSIS — I10 Essential (primary) hypertension: Secondary | ICD-10-CM | POA: Insufficient documentation

## 2016-03-20 MED ORDER — HYDROCOD POLST-CPM POLST ER 10-8 MG/5ML PO SUER: 5.0000 mL | Freq: Every evening | ORAL | 0 refills | Status: DC | PRN

## 2016-03-20 MED FILL — HYDROCODONE-CHLORPHENIRAM S: 10-8 | 28 days supply | Qty: 140 | Fill #0

## 2016-03-20 NOTE — ED Triage Notes (Signed)
Cough, nasal congestion, sore throat, headache, and chills x 2 days. She was exposed to flu.

## 2016-03-20 NOTE — ED Provider Notes (Signed)
[facility] Provider [facility]   CSN: [id number] Arrival date & time: 03/20/16  1202     History   Chief Complaint Chief Complaint  Patient presents with  . Cough    HPI [name] is a 56 y.o. female.  Patient is a 56 year old female who presents with cough and nasal congestion and achiness. She states it started 3 days ago. She did have an exposure to influenza. She denies any nausea or vomiting. She does have diffuse myalgias. She's had some subjective fevers at home. She denies any shortness of breath. Her cough is productive of yellow sputum. She's been using over-the-counter medicines with some improvement in symptoms.      Past Medical History:  Diagnosis Date  . Anemia   . Anxiety   . Chronic back pain   . Chronic constipation   . Depression   . DVT (deep venous thrombosis) (HCC)   . Fibromyalgia   . GERD (gastroesophageal reflux disease)   . Hayfever   . Hyperlipidemia   . Hypertension   . Internal hemorrhoids   . Left ureteral calculus   . Migraines   . Vitamin D deficiency     There are no active problems to display for this patient.   Past Surgical History:  Procedure Laterality Date  . BUNIONECTOMY  2009  . CESAREAN SECTION  1984  . PARTIAL HYSTERECTOMY  2001    OB History    No data available       Home Medications    Prior to Admission medications   Medication Sig Start Date End Date Taking? Authorizing Provider  ALPRAZolam Prudy Feeler) 1 MG tablet 1 tablet Three times a day. 06/14/10   Historical Provider, MD  amoxicillin-clavulanate (AUGMENTIN) 875-125 MG tablet Take 1 tablet by mouth every 12 (twelve) hours. 07/17/15   Eyvonne Mechanic, PA-C  buPROPion (WELLBUTRIN XL) 300 MG 24 hr tablet Take 300 mg by mouth daily.      Historical Provider, MD  chlorpheniramine-HYDROcodone (TUSSIONEX PENNKINETIC ER) 10-8 MG/5ML SUER Take 5 mLs by mouth at bedtime as needed for cough. 03/20/16   Rolan Bucco, MD  Cholecalciferol (VITAMIN D3) 2000  UNITS TABS Take 1 tablet by mouth daily.      Historical Provider, MD  diclofenac (CATAFLAM) 50 MG tablet Take 1 tablet (50 mg total) by mouth 3 (three) times daily. For leg [pain Patient not taking: Reported on 06/23/2015 04/15/13   Linna Hoff, MD  doxycycline (VIBRAMYCIN) 100 MG capsule Take 1 capsule (100 mg total) by mouth 2 (two) times daily. Patient not taking: Reported on 06/23/2015 04/15/13   Linna Hoff, MD  esomeprazole (NEXIUM) 40 MG capsule Take 1 capsule (40 mg total) by mouth 2 (two) times daily. 09/12/10 09/12/11  Meryl Dare, MD  fluticasone (FLONASE) 50 MCG/ACT nasal spray Place 2 sprays into both nostrils daily. Patient taking differently: Place 2 sprays into both nostrils daily as needed for allergies or rhinitis.  04/12/13   Cathlyn Parsons, NP  gabapentin (NEURONTIN) 300 MG capsule Take 1,200 mg by mouth at bedtime. 06/05/15   Historical Provider, MD  HYDROcodone-acetaminophen (NORCO) 5-325 MG tablet Take 1-2 tablets by mouth every 4 (four) hours as needed for severe pain. 06/23/15   Loren Racer, MD  ibuprofen (ADVIL,MOTRIN) 600 MG tablet Take 600 mg by mouth 2 (two) times daily.    Historical Provider, MD  ipratropium (ATROVENT) 0.06 % nasal spray Place 2 sprays into both nostrils 4 (four) times daily. Patient not  taking: Reported on 06/23/2015 04/15/13   Linna Hoff, MD  lamoTRIgine (LAMICTAL) 100 MG tablet Take 100 mg by mouth 2 (two) times daily.      Historical Provider, MD  methylphenidate (RITALIN) 20 MG tablet 20 mg every morning.  06/11/10   Historical Provider, MD  Multiple Vitamin (MULTIVITAMIN) tablet Take 1 tablet by mouth daily.      Historical Provider, MD  senna-docusate (SENNA PLUS) 8.6-50 MG tablet Take 2 tablets by mouth 2 (two) times daily.    Historical Provider, MD  spironolactone (ALDACTONE) 50 MG tablet Take 50 mg by mouth daily. 06/02/15   Historical Provider, MD  SUMAtriptan (IMITREX) 50 MG tablet Take 50 mg by mouth daily as needed for migraine.      Historical Provider, MD  Tapentadol HCl (NUCYNTA) 100 MG TABS Take 100 mg by mouth 2 (two) times daily.    Historical Provider, MD  traZODone (DESYREL) 100 MG tablet Take 100 mg by mouth at bedtime.      Historical Provider, MD    Family History Family History  Problem Relation Age of Onset  . Osteoarthritis Mother   . Bipolar disorder Mother   . Hyperlipidemia Father   . Diabetes Maternal Grandmother   . Stroke Maternal Grandfather   . Colon cancer Neg Hx     Social History Social History  Substance Use Topics  . Smoking status: Never Smoker  . Smokeless tobacco: Never Used  . Alcohol use No     Allergies   Penicillins; Mirapex [pramipexole dihydrochloride]; and Sulfa drugs cross reactors   Review of Systems Review of Systems  Constitutional: Positive for fatigue. Negative for chills, diaphoresis and fever.  HENT: Positive for congestion and rhinorrhea. Negative for sneezing.   Eyes: Negative.   Respiratory: Positive for cough. Negative for chest tightness and shortness of breath.   Cardiovascular: Negative for chest pain and leg swelling.  Gastrointestinal: Negative for abdominal pain, blood in stool, diarrhea, nausea and vomiting.  Genitourinary: Negative for difficulty urinating, flank pain, frequency and hematuria.  Musculoskeletal: Positive for myalgias. Negative for arthralgias and back pain.  Skin: Negative for rash.  Neurological: Negative for dizziness, speech difficulty, weakness, numbness and headaches.     Physical Exam Updated Vital Signs BP 115/66 (BP Location: Right Arm)   Pulse 91   Temp 98.3 F (36.8 C) (Oral)   Resp 18   Ht 5\' 6"  (1.676 m)   Wt 123 lb (55.8 kg)   SpO2 98%   BMI 19.85 kg/m   Physical Exam  Constitutional: She is oriented to person, place, and time. She appears well-developed and well-nourished.  HENT:  Head: Normocephalic and atraumatic.  Right Ear: External ear normal.  Left Ear: External ear normal.  Mouth/Throat:  Oropharynx is clear and moist.  Eyes: Pupils are equal, round, and reactive to light.  Neck: Normal range of motion. Neck supple.  Cardiovascular: Normal rate, regular rhythm and normal heart sounds.   Pulmonary/Chest: Effort normal and breath sounds normal. No respiratory distress. She has no wheezes. She has no rales. She exhibits no tenderness.  Abdominal: Soft. Bowel sounds are normal. There is no tenderness. There is no rebound and no guarding.  Musculoskeletal: Normal range of motion. She exhibits no edema.  Lymphadenopathy:    She has no cervical adenopathy.  Neurological: She is alert and oriented to person, place, and time.  Skin: Skin is warm and dry. No rash noted.  Psychiatric: She has a normal mood and affect.  ED Treatments / Results  Labs (all labs ordered are listed, but only abnormal results are displayed) Labs Reviewed - No data to display  EKG  EKG Interpretation None       Radiology Dg Chest 2 View  Result Date: 03/20/2016 CLINICAL DATA:  Cough, nasal congestion, sore throat EXAM: CHEST  2 VIEW COMPARISON:  11/19/2009 FINDINGS: Cardiomediastinal silhouette is stable. No acute infiltrate or pleural effusion. No pulmonary edema. Mild degenerative changes thoracic spine. IMPRESSION: No active cardiopulmonary disease. Electronically Signed   By: Natasha MeadLiviu  Pop M.D.   On: 03/20/2016 13:03    Procedures Procedures (including critical care time)  Medications Ordered in ED Medications - No data to display   Initial Impression / Assessment and Plan / ED Course  I have reviewed the triage vital signs and the nursing notes.  Pertinent labs & imaging results that were available during my care of the patient were reviewed by me and considered in my medical decision making (see chart for details).  Clinical Course    Patient presents with flulike illness. She's out of the window for Tamiflu. She otherwise well-appearing. There is no signs of pneumonia. She was  discharged home in good condition. She was given a prescription for Tussionex cough syrup. She was encouraged to follow-up with her PCP if her symptoms are not improving or return here as needed for any worsening symptoms.  Final Clinical Impressions(s) / ED Diagnoses   Final diagnoses:  Influenza-like illness    New Prescriptions Discharge Medication List as of 03/20/2016  1:33 PM    START taking these medications   Details  chlorpheniramine-HYDROcodone (TUSSIONEX PENNKINETIC ER) 10-8 MG/5ML SUER Take 5 mLs by mouth at bedtime as needed for cough., Starting Mon 03/20/2016, Print         Rolan BuccoMelanie Shogo Larkey, MD 03/20/16 [phone]

## 2016-03-20 NOTE — ED Notes (Signed)
ED Provider at bedside. 

## 2017-08-15 ENCOUNTER — Emergency Department (HOSPITAL_COMMUNITY)
Admission: EM | Admit: 2017-08-15 | Discharge: 2017-08-15 | Disposition: A | Discharge status: Home / Self Care | Payer: Medicare Other | Attending: Emergency Medicine | Admitting: Emergency Medicine

## 2017-08-15 ENCOUNTER — Emergency Department (HOSPITAL_COMMUNITY): Payer: Medicare Other

## 2017-08-15 ENCOUNTER — Encounter (HOSPITAL_COMMUNITY): Payer: Self-pay

## 2017-08-15 DIAGNOSIS — W010XXA Fall on same level from slipping, tripping and stumbling without subsequent striking against object, initial encounter: Secondary | ICD-10-CM | POA: Insufficient documentation

## 2017-08-15 DIAGNOSIS — M25561 Pain in right knee: Secondary | ICD-10-CM | POA: Diagnosis not present

## 2017-08-15 DIAGNOSIS — Z79899 Other long term (current) drug therapy: Secondary | ICD-10-CM | POA: Insufficient documentation

## 2017-08-15 DIAGNOSIS — I10 Essential (primary) hypertension: Secondary | ICD-10-CM | POA: Diagnosis not present

## 2017-08-15 NOTE — ED Provider Notes (Signed)
[facility]-EMERGENCY DEPT Provider Note   CSN: [id number] Arrival date & time: 08/15/17  0555   History   Chief Complaint Chief Complaint  Patient presents with  . Knee Pain    HPI [name] is a 57 y.o. female with past medical history of migraines, chronic back pain, depression, anxiety, hypertension, hyperlipidemia who presented to the ED with right knee pain.  She [place] when she stepped down and twisted her knee.  She reports no pain or discomfort immediately following the event but gradually developed increased pain and swelling of her right knee that has persisted.  Pain is constant, worsens with standing/walking, no alleviating factors noted.  She is able to walk and notes that she has continued to stand and perform chores around the house.  She feels her pain is swelling is worse at night at the end of the day.  She denies numbness or tingling, no focal weakness, no falls since that time.   Past Medical History:  Diagnosis Date  . Anemia   . Anxiety   . Chronic back pain   . Chronic constipation   . Depression   . DVT (deep venous thrombosis) (HCC)   . Fibromyalgia   . GERD (gastroesophageal reflux disease)   . Hayfever   . Hyperlipidemia   . Hypertension   . Internal hemorrhoids   . Left ureteral calculus   . Migraines   . Vitamin D deficiency     There are no active problems to display for this patient.   Past Surgical History:  Procedure Laterality Date  . BUNIONECTOMY  2009  . CESAREAN SECTION  1984  . PARTIAL HYSTERECTOMY  2001     OB History   None      Home Medications    Prior to Admission medications   Medication Sig Start Date End Date Taking? Authorizing Provider  ALPRAZolam Prudy Feeler) 1 MG tablet 1 tablet Three times a day. 06/14/10   [provider]  amoxicillin-clavulanate (AUGMENTIN) 875-125 MG tablet Take 1 tablet by mouth every 12 (twelve)  hours. 07/17/15   Hedges, Tinnie Gens, PA-C  buPROPion (WELLBUTRIN XL) 300 MG 24 hr tablet Take 300 mg by mouth daily.      [provider]  chlorpheniramine-HYDROcodone (TUSSIONEX PENNKINETIC ER) 10-8 MG/5ML SUER Take 5 mLs by mouth at bedtime as needed for cough. 03/20/16   Rolan Bucco, MD  Cholecalciferol (VITAMIN D3) 2000 UNITS TABS Take 1 tablet by mouth daily.      [provider]  diclofenac (CATAFLAM) 50 MG tablet Take 1 tablet (50 mg total) by mouth 3 (three) times daily. For leg [pain Patient not taking: Reported on 06/23/2015 04/15/13   Linna Hoff, MD  doxycycline (VIBRAMYCIN) 100 MG capsule Take 1 capsule (100 mg total) by mouth 2 (two) times daily. Patient not taking: Reported on 06/23/2015 04/15/13   Linna Hoff, MD  esomeprazole (NEXIUM) 40 MG capsule Take 1 capsule (40 mg total) by mouth 2 (two) times daily. 09/12/10 09/12/11  Meryl Dare, MD  fluticasone (FLONASE) 50 MCG/ACT nasal spray Place 2 sprays into both nostrils daily. Patient taking differently: Place 2 sprays into both nostrils daily as needed for allergies or rhinitis.  04/12/13   Cathlyn Parsons, NP  gabapentin (NEURONTIN) 300 MG capsule Take 1,200 mg by mouth at bedtime. 06/05/15   [provider]  HYDROcodone-acetaminophen (NORCO) 5-325 MG tablet Take 1-2 tablets by  mouth every 4 (four) hours as needed for severe pain. 06/23/15   Loren RacerYelverton, David, MD  ibuprofen (ADVIL,MOTRIN) 600 MG tablet Take 600 mg by mouth 2 (two) times daily.    [provider]  ipratropium (ATROVENT) 0.06 % nasal spray Place 2 sprays into both nostrils 4 (four) times daily. Patient not taking: Reported on 06/23/2015 04/15/13   Linna HoffKindl, James D, MD  lamoTRIgine (LAMICTAL) 100 MG tablet Take 100 mg by mouth 2 (two) times daily.      [provider]  methylphenidate (RITALIN) 20 MG tablet 20 mg every morning.  06/11/10   [provider]  Multiple Vitamin (MULTIVITAMIN) tablet Take 1 tablet by mouth daily.       [provider]  senna-docusate (SENNA PLUS) 8.6-50 MG tablet Take 2 tablets by mouth 2 (two) times daily.    [provider]  spironolactone (ALDACTONE) 50 MG tablet Take 50 mg by mouth daily. 06/02/15   [provider]  SUMAtriptan (IMITREX) 50 MG tablet Take 50 mg by mouth daily as needed for migraine.     [provider]  Tapentadol HCl (NUCYNTA) 100 MG TABS Take 100 mg by mouth 2 (two) times daily.    [provider]  traZODone (DESYREL) 100 MG tablet Take 100 mg by mouth at bedtime.      [provider]    Family History Family History  Problem Relation Age of Onset  . Osteoarthritis Mother   . Bipolar disorder Mother   . Hyperlipidemia Father   . Diabetes Maternal Grandmother   . Stroke Maternal Grandfather   . Colon cancer Neg Hx     Social History Social History   Tobacco Use  . Smoking status: Never Smoker  . Smokeless tobacco: Never Used  Substance Use Topics  . Alcohol use: No  . Drug use: No     Allergies   Penicillins; Mirapex [pramipexole dihydrochloride]; and Sulfa drugs cross reactors   Review of Systems Review of Systems  Constitutional: Negative for fever.  Musculoskeletal:       Knee pain   Skin: Negative for wound.  Neurological: Negative for weakness and numbness.     Physical Exam Updated Vital Signs BP 97/72   Pulse 73   Temp 98 F (36.7 C) (Oral)   Resp 16   Ht 5\' 6"  (1.676 m)   Wt 54 kg (119 lb)   SpO2 97%   BMI 19.21 kg/m   General: Resting on ED stretcher comfortably, no acute distress Head: Normocephalic, atraumatic Eyes: EOMI, normal conjuctiva  ENT: Moist mucus membranes CV: RRR, no murmur appreciated  Resp: Clear breath sounds bilaterally, normal work of breathing, no distress  Extr: Mild TTP of medial and lateral joint lines of R knee, no effusion appreciated, no joint laxity on testing of medial, laterial, anterior, and posterior cruciate ligaments, no crepitus.  Full ROM. Distal pulses and sensation intact, no ecchymosis to knee or distal foot  Neuro: Alert and oriented x3, moving all extremities, no gross neurologic deficit  Skin: Warm, dry      ED Treatments / Results  Labs (all labs ordered are listed, but only abnormal results are displayed) Labs Reviewed - No data to display  EKG None  Radiology Dg Knee Complete 4 Views Right  Result Date: 08/15/2017 CLINICAL DATA:  Pain following twisting injury EXAM: RIGHT KNEE - COMPLETE 4+ VIEW COMPARISON:  None. FINDINGS: Frontal, lateral, and bilateral oblique views were obtained. There is no fracture or dislocation. There  is no appreciable joint effusion. Joint spaces appear normal. No erosive change. IMPRESSION: No fracture or dislocation. No appreciable joint effusion. No evident arthropathy. Electronically Signed   By: Bretta Bang III M.D.   On: 08/15/2017 07:17    Procedures Procedures (including critical care time)  Medications Ordered in ED Medications - No data to display   Initial Impression / Assessment and Plan / ED Course  I have reviewed the triage vital signs and the nursing notes.  Pertinent labs & imaging results that were available during my care of the patient were reviewed by me and considered in my medical decision making (see chart for details).  57 year old female presenting with right knee pain following a twisting motion while stepping down on June 3.  Her symptoms appear to be mild and not limiting her functional status.  She may benefit from more rest, ice, elevation she has performed up to this point.  X-rays negative for any fracture or malalignment laxity on exam to suggest serious ligamentous injury.  Other causes such as meniscal injury not ruled out but suspect this is more likely a muscle or ligament strain that should continue to improve with supportive care. Will also provide knee sleeve for comfort and support. Pt has made an appointment with orthopedic  specialist for next month. Provided expectant management and reassurance, can follow up with ortho, especially if sx persist longer than expected. Stable for discharge.   Final Clinical Impressions(s) / ED Diagnoses   Final diagnoses:  Acute pain of right knee    ED Discharge Orders    None       Ginger Carne, MD 08/15/17 1610    Derwood Kaplan, MD 08/15/17 [phone]

## 2017-08-15 NOTE — Discharge Instructions (Addendum)
Nice to meet you Ms. [name].  I am sorry you are still having this knee pain. Based on the X ray and exam of your knee, this is likely a muscle strain which can take a while to fully heal, especially since you still have to take care of your home and can't fully rest. This should continue to gradually heal with rest, and continuing to apply ice, anti-inflammatory medicines, elevation while sitting or laying down. You can also follow up with the orthopedic appointment in case it is not healing as expected and needs any further evaluation.

## 2017-08-15 NOTE — ED Triage Notes (Signed)
Pt twisted her knee going down a flight of stairs June 3rd

## 2017-08-18 ENCOUNTER — Emergency Department (HOSPITAL_COMMUNITY)
Admission: EM | Admit: 2017-08-18 | Discharge: 2017-08-18 | Disposition: A | Discharge status: Home / Self Care | Payer: Medicare Other | Attending: Emergency Medicine | Admitting: Emergency Medicine

## 2017-08-18 ENCOUNTER — Other Ambulatory Visit: Payer: Self-pay

## 2017-08-18 ENCOUNTER — Emergency Department (HOSPITAL_BASED_OUTPATIENT_CLINIC_OR_DEPARTMENT_OTHER)
Admission: EM | Admit: 2017-08-18 | Discharge: 2017-08-18 | Disposition: A | Discharge status: Home / Self Care | Payer: Medicare Other | Source: Home / Self Care

## 2017-08-18 ENCOUNTER — Encounter (HOSPITAL_COMMUNITY): Payer: Self-pay | Admitting: Emergency Medicine

## 2017-08-18 DIAGNOSIS — Z79899 Other long term (current) drug therapy: Secondary | ICD-10-CM | POA: Diagnosis not present

## 2017-08-18 DIAGNOSIS — M7121 Synovial cyst of popliteal space [Baker], right knee: Secondary | ICD-10-CM | POA: Diagnosis not present

## 2017-08-18 DIAGNOSIS — M79609 Pain in unspecified limb: Secondary | ICD-10-CM

## 2017-08-18 DIAGNOSIS — M79604 Pain in right leg: Secondary | ICD-10-CM | POA: Insufficient documentation

## 2017-08-18 DIAGNOSIS — I10 Essential (primary) hypertension: Secondary | ICD-10-CM | POA: Diagnosis not present

## 2017-08-18 DIAGNOSIS — M25461 Effusion, right knee: Secondary | ICD-10-CM | POA: Diagnosis present

## 2017-08-18 DIAGNOSIS — M25469 Effusion, unspecified knee: Secondary | ICD-10-CM

## 2017-08-18 NOTE — ED Triage Notes (Signed)
Pt reports pain in back of calf and r/knee swelling x 2 days. .Pt injured knee last week. Pain has increased over last two days. Pt stated that she felt a had knot last night and decided to sleep in knee sleeve. Pt was directed by PCP to be seen in ED due to hx of blood clot in l/leg. Pt is alert , oriented and ambulatory. Drove self to hospital.

## 2017-08-18 NOTE — ED Provider Notes (Signed)
[facility]-EMERGENCY DEPT Provider Note   CSN: [id number] Arrival date & time: 08/18/17  1141     History   Chief Complaint Chief Complaint  Patient presents with  . Knee Pain  . Joint Swelling    HPI [name] is a 57 y.o. female with multiple chronic medical problems as listed in HPI who presents to the ED with right calf and knee pain with swelling x 2 days. Patient injured her knee last week. Patient reports hx of blood clots. She reports feeling a knot in her calf last night so she slept in a knee sleeve. Patient called her PCP and was told to come to the ED for evaluation for possible DVT. Last DVT was in the left calf. Patient denies chest pain or shortness of breath. HPI  Past Medical History:  Diagnosis Date  . Anemia   . Anxiety   . Chronic back pain   . Chronic constipation   . Depression   . DVT (deep venous thrombosis) (HCC)   . Fibromyalgia   . GERD (gastroesophageal reflux disease)   . Hayfever   . Hyperlipidemia   . Hypertension   . Internal hemorrhoids   . Left ureteral calculus   . Migraines   . Vitamin D deficiency     There are no active problems to display for this patient.   Past Surgical History:  Procedure Laterality Date  . BUNIONECTOMY  2009  . CESAREAN SECTION  1984  . PARTIAL HYSTERECTOMY  2001  . WISDOM TOOTH EXTRACTION       OB History   None      Home Medications    Prior to Admission medications   Medication Sig Start Date End Date Taking? Authorizing Provider  ALPRAZolam Prudy Feeler(XANAX) 1 MG tablet 1 tablet Three times a day. 06/14/10   [provider]  amoxicillin-clavulanate (AUGMENTIN) 875-125 MG tablet Take 1 tablet by mouth every 12 (twelve) hours. 07/17/15   Hedges, Tinnie GensJeffrey, PA-C  buPROPion (WELLBUTRIN XL) 300 MG 24 hr tablet Take 300 mg by mouth daily.      [provider]  chlorpheniramine-HYDROcodone (TUSSIONEX PENNKINETIC ER) 10-8 MG/5ML SUER Take 5 mLs by mouth at bedtime as  needed for cough. 03/20/16   Rolan BuccoBelfi, Melanie, MD  Cholecalciferol (VITAMIN D3) 2000 UNITS TABS Take 1 tablet by mouth daily.      [provider]  esomeprazole (NEXIUM) 40 MG capsule Take 1 capsule (40 mg total) by mouth 2 (two) times daily. 09/12/10 09/12/11  Meryl DareStark, Malcolm T, MD  gabapentin (NEURONTIN) 300 MG capsule Take 1,200 mg by mouth at bedtime. 06/05/15   [provider]  HYDROcodone-acetaminophen (NORCO) 5-325 MG tablet Take 1-2 tablets by mouth every 4 (four) hours as needed for severe pain. 06/23/15   Loren RacerYelverton, David, MD  ibuprofen (ADVIL,MOTRIN) 600 MG tablet Take 600 mg by mouth 2 (two) times daily.    [provider]  lamoTRIgine (LAMICTAL) 100 MG tablet Take 100 mg by mouth 2 (two) times daily.      [provider]  methylphenidate (RITALIN) 20 MG tablet 20 mg every morning.  06/11/10   [provider]  Multiple Vitamin (MULTIVITAMIN) tablet Take 1 tablet by mouth daily.      [provider]  senna-docusate (SENNA PLUS) 8.6-50 MG tablet Take 2 tablets by mouth 2 (two) times daily.    [provider]  spironolactone (ALDACTONE) 50 MG tablet Take 50 mg by mouth daily. 06/02/15   [provider]  SUMAtriptan (IMITREX) 50 MG tablet Take 50 mg by mouth daily as needed for migraine.     [provider]  Tapentadol HCl (NUCYNTA) 100 MG TABS Take 100 mg by mouth 2 (two) times daily.    [provider]  traZODone (DESYREL) 100 MG tablet Take 100 mg by mouth at bedtime.      [provider]    Family History Family History  Problem Relation Age of Onset  . Osteoarthritis Mother   . Bipolar disorder Mother   . Hyperlipidemia Father   . Alzheimer's disease Father   . Hypertension Father   . Diabetes Maternal Grandmother   . Stroke Maternal Grandfather   . Colon cancer Neg Hx     Social History Social History   Tobacco Use  . Smoking status: Never Smoker  . Smokeless tobacco: Never Used    Substance Use Topics  . Alcohol use: No  . Drug use: No     Allergies   Penicillins; Mirapex [pramipexole dihydrochloride]; and Sulfa drugs cross reactors   Review of Systems Review of Systems  Constitutional: Negative for chills and fever.  HENT: Negative.   Respiratory: Negative for shortness of breath.   Cardiovascular: Negative for chest pain.  Musculoskeletal: Positive for arthralgias.       Right calf pain, right knee pain  Skin: Negative for wound.  Neurological: Negative for headaches.  Psychiatric/Behavioral: Negative for confusion.     Physical Exam Updated Vital Signs BP 112/84 (BP Location: Right Arm)   Pulse 68   Temp 98.3 F (36.8 C) (Oral)   Resp 18   Wt 54.4 kg (120 lb)   SpO2 98%   BMI 19.37 kg/m   Physical Exam  Constitutional: She appears well-developed and well-nourished. No distress.  HENT:  Head: Normocephalic.  Eyes: EOM are normal.  Neck: Neck supple.  Cardiovascular: Normal rate and intact distal pulses.  Pulmonary/Chest: Effort normal.  Musculoskeletal:       Right knee: She exhibits swelling and deformity. She exhibits normal range of motion, no laceration, no erythema and normal alignment. Tenderness found.       Right lower leg: She exhibits tenderness.       Legs: Neurological: She is alert.  Skin: Skin is warm and dry.  Psychiatric: She has a normal mood and affect.  Nursing note and vitals reviewed.    ED Treatments / Results  Labs (all labs ordered are listed, but only abnormal results are displayed) Labs Reviewed - No data to display  Radiology Doppler study negative for DVT. Positive for Baker Cyst. Procedures Procedures (including critical care time)  Medications Ordered in ED Medications - No data to display   Initial Impression / Assessment and Plan / ED Course  I have reviewed the triage vital signs and the nursing notes. 57 y.o. female here with right knee and calf pain stable for d/c with negative DVT  study. Discussed with the patient results of study and Baker Cyst. Patient to f/u with ortho. She will continue NSAIDS as needed. She will return for any problems.   Final Clinical Impressions(s) / ED Diagnoses   Final diagnoses:  Knee swelling  Baker cyst, right    ED Discharge Orders    None       Kerrie Buffalo [place], [place] 08/18/17 1420    Maia Plan, MD 08/18/17 [phone]

## 2017-08-18 NOTE — Progress Notes (Signed)
VASCULAR LAB PRELIMINARY  PRELIMINARY  PRELIMINARY  PRELIMINARY  Right lower extremity venous duplex completed.    Preliminary report:  There is no DVT or SVT noted in the right lower extremity.  There is an intact Baker's cyst noted in the right popliteal fossa.  Gave results to [place], PA-C  Kenan Moodie, RVT 08/18/2017, 2:39 PM

## 2017-08-18 NOTE — Discharge Instructions (Signed)
Follow up with the orthopedic doctor for further evaluation and treatment of the Baker Cyst. Return here as needed.

## 2017-12-16 ENCOUNTER — Emergency Department (HOSPITAL_COMMUNITY): Payer: Medicare Other

## 2017-12-16 ENCOUNTER — Emergency Department (HOSPITAL_COMMUNITY)
Admission: EM | Admit: 2017-12-16 | Discharge: 2017-12-16 | Disposition: A | Discharge status: Home / Self Care | Payer: Medicare Other | Attending: Emergency Medicine | Admitting: Emergency Medicine

## 2017-12-16 ENCOUNTER — Other Ambulatory Visit: Payer: Self-pay

## 2017-12-16 ENCOUNTER — Encounter (HOSPITAL_COMMUNITY): Payer: Self-pay

## 2017-12-16 DIAGNOSIS — Y999 Unspecified external cause status: Secondary | ICD-10-CM | POA: Insufficient documentation

## 2017-12-16 DIAGNOSIS — I10 Essential (primary) hypertension: Secondary | ICD-10-CM | POA: Diagnosis not present

## 2017-12-16 DIAGNOSIS — Y939 Activity, unspecified: Secondary | ICD-10-CM | POA: Insufficient documentation

## 2017-12-16 DIAGNOSIS — R0781 Pleurodynia: Secondary | ICD-10-CM | POA: Diagnosis present

## 2017-12-16 DIAGNOSIS — Y929 Unspecified place or not applicable: Secondary | ICD-10-CM | POA: Diagnosis not present

## 2017-12-16 DIAGNOSIS — Z79899 Other long term (current) drug therapy: Secondary | ICD-10-CM | POA: Diagnosis not present

## 2017-12-16 DIAGNOSIS — W01198A Fall on same level from slipping, tripping and stumbling with subsequent striking against other object, initial encounter: Secondary | ICD-10-CM | POA: Insufficient documentation

## 2017-12-16 DIAGNOSIS — R413 Other amnesia: Secondary | ICD-10-CM | POA: Insufficient documentation

## 2017-12-16 DIAGNOSIS — W19XXXA Unspecified fall, initial encounter: Secondary | ICD-10-CM

## 2017-12-16 MED ORDER — DICLOFENAC EPOLAMINE 1.3 % TD PTCH
1.0000 | MEDICATED_PATCH | Freq: Two times a day (BID) | TRANSDERMAL | Status: DC
  Administered 2017-12-16: 1 via TRANSDERMAL
  Filled 2017-12-16: qty 1

## 2017-12-16 NOTE — ED Notes (Signed)
Got patient undressed into gown

## 2017-12-16 NOTE — ED Notes (Signed)
ED Provider at bedside. 

## 2017-12-16 NOTE — ED Triage Notes (Signed)
Pt comes from home. Pt is AOx4 and ambulatory. Pt chief complaint is Left Sided Rib pain. This past Thursday at night, pt woke up to use restroom and, felt dizzy and fell on the sink hitting left side of ribs and believes she hit the left side of head. Pt is taking baby Asprin and since accident on Thursday night has been feeling somewhat disoriented at times.

## 2017-12-16 NOTE — ED Provider Notes (Signed)
[facility]-EMERGENCY DEPT Provider Note   CSN: [id number] Arrival date & time: 12/16/17  0850     History   Chief Complaint Chief Complaint  Patient presents with  . Left Rib Pain  . Fall    HPI CHANLEY MCENERY is a 57 y.o. female.  HPI Patient presents with 2 concerns. Patient has multiple medical issues, including anemia, anxiety, migraines, but presents due to a fall that occurred 2 days ago.  When she notes that she stumbled, fell, striking her head and left inferior anterior chest wall against sink. No loss of consciousness, no subsequent visual disturbance, weakness in any extremity, syncope, confusion. She has had some mild repetitive action, memory loss, but no speech changes. She has had, however,'s severe persistent sore pain in the left anterior inferior chest wall, not improved with OTC medication. No fever, no cough. Today she spoke with a nursing hotline and was referred here, due to concern for possible fracture as well as possible intracranial hemorrhage given the patient's endorsement of aspirin use.  Past Medical History:  Diagnosis Date  . Anemia   . Anxiety   . Chronic back pain   . Chronic constipation   . Depression   . DVT (deep venous thrombosis) (HCC)   . Fibromyalgia   . GERD (gastroesophageal reflux disease)   . Hayfever   . Hyperlipidemia   . Hypertension   . Internal hemorrhoids   . Left ureteral calculus   . Migraines   . Vitamin D deficiency     There are no active problems to display for this patient.   Past Surgical History:  Procedure Laterality Date  . BUNIONECTOMY  2009  . CESAREAN SECTION  1984  . PARTIAL HYSTERECTOMY  2001  . WISDOM TOOTH EXTRACTION       OB History   None      Home Medications    Prior to Admission medications   Medication Sig Start Date End Date Taking? Authorizing Provider  ALPRAZolam Prudy Feeler) 1 MG tablet Take 1 tablet by mouth 4 (four) times daily.  06/14/10  Yes  [provider]  buPROPion (WELLBUTRIN XL) 150 MG 24 hr tablet Take 450 mg by mouth daily. 11/20/17  Yes [provider]  gabapentin (NEURONTIN) 600 MG tablet Take 1,200 mg by mouth at bedtime. 10/13/17  Yes [provider]  lamoTRIgine (LAMICTAL) 100 MG tablet Take 100 mg by mouth 2 (two) times daily.     Yes [provider]  mirtazapine (REMERON) 15 MG tablet Take 15 mg by mouth at bedtime. 11/20/17  Yes [provider]  Multiple Vitamin (MULTIVITAMIN) tablet Take 1 tablet by mouth daily.     Yes [provider]  senna-docusate (SENNA PLUS) 8.6-50 MG tablet Take 2 tablets by mouth 2 (two) times daily.   Yes [provider]  spironolactone (ALDACTONE) 50 MG tablet Take 50 mg by mouth daily. 06/02/15  Yes [provider]  SUMAtriptan (IMITREX) 50 MG tablet Take 50 mg by mouth daily as needed for migraine.    Yes [provider]  traZODone (DESYREL) 100 MG tablet Take 100 mg by mouth at bedtime.     Yes [provider]    Family History Family History  Problem Relation Age of Onset  . Osteoarthritis Mother   . Bipolar disorder Mother   . Hyperlipidemia Father   . Alzheimer's disease Father   . Hypertension Father   . Diabetes Maternal Grandmother   . Stroke Maternal  Grandfather   . Colon cancer Neg Hx     Social History Social History   Tobacco Use  . Smoking status: Never Smoker  . Smokeless tobacco: Never Used  Substance Use Topics  . Alcohol use: No  . Drug use: No     Allergies   Mirapex [pramipexole dihydrochloride]; Sulfa drugs cross reactors; and Penicillins   Review of Systems Review of Systems  Constitutional:       Per HPI, otherwise negative  HENT:       Per HPI, otherwise negative  Respiratory:       Per HPI, otherwise negative  Cardiovascular:       Per HPI, otherwise negative  Gastrointestinal: Negative for vomiting.  Endocrine:       Negative aside from HPI    Genitourinary:       Neg aside from HPI   Musculoskeletal:       Per HPI, otherwise negative  Skin: Negative.   Neurological: Negative for syncope.     Physical Exam Updated Vital Signs BP 123/81 (BP Location: Right Arm)   Pulse 84   Temp 98.9 F (37.2 C) (Oral)   Resp 16   Ht 5\' 6"  (1.676 m)   Wt 56.2 kg   SpO2 100%   BMI 20.01 kg/m   Physical Exam  Constitutional: She is oriented to person, place, and time. She appears well-developed and well-nourished. No distress.  HENT:  Head: Normocephalic and atraumatic.  Eyes: Conjunctivae and EOM are normal.  Cardiovascular: Normal rate and regular rhythm.  Pulmonary/Chest: Effort normal and breath sounds normal. No stridor. No respiratory distress.    Abdominal: She exhibits no distension.    Musculoskeletal: She exhibits no edema.  Neurological: She is alert and oriented to person, place, and time. She displays no atrophy and no tremor. No cranial nerve deficit. She exhibits normal muscle tone. She displays no seizure activity. Coordination and gait normal.  Skin: Skin is warm and dry.  Psychiatric: She has a normal mood and affect.  Nursing note and vitals reviewed.    ED Treatments / Results  Labs (all labs ordered are listed, but only abnormal results are displayed) Labs Reviewed - No data to display  EKG None  Radiology Dg Ribs Unilateral W/chest Left  Result Date: 12/16/2017 CLINICAL DATA:  Recent fall with rib pain, initial encounter EXAM: LEFT RIBS AND CHEST - 3+ VIEW COMPARISON:  03/20/2016 FINDINGS: No fracture or other bone lesions are seen involving the ribs. There is no evidence of pneumothorax or pleural effusion. Both lungs are clear. Heart size and mediastinal contours are within normal limits. IMPRESSION: No acute rib fractures are noted. Electronically Signed   By: Alcide Clever M.D.   On: 12/16/2017 10:17   Ct Chest Wo Contrast  Result Date: 12/16/2017 CLINICAL DATA:  Left-sided rib pain after  fall on Thursday. Evaluate for fracture. EXAM: CT CHEST WITHOUT CONTRAST TECHNIQUE: Multidetector CT imaging of the chest was performed following the standard protocol without IV contrast. COMPARISON:  Left rib x-rays from same day. CT chest dated April 04, 2007. FINDINGS: Cardiovascular: Normal heart size. No pericardial effusion. Normal caliber thoracic aorta. Minimal aortic atherosclerosis. Mediastinum/Nodes: No enlarged mediastinal or axillary lymph nodes. Thyroid gland, trachea, and esophagus demonstrate no significant findings. Lungs/Pleura: No focal consolidation, pleural effusion, or pneumothorax. No suspicious pulmonary nodule. Minimal biapical pleuroparenchymal scarring. Upper Abdomen: No acute abnormality. Subcentimeter low-density lesions in the left hepatic lobe remain too small to characterize but unchanged and likely represent  small cysts. Musculoskeletal: No chest wall mass or suspicious bone lesions identified. No acute fracture. IMPRESSION: 1.  No active cardiopulmonary disease.  No rib fracture. 2.  Aortic atherosclerosis (ICD10-I70.0). Electronically Signed   By: Obie Dredge M.D.   On: 12/16/2017 11:52    Procedures Procedures (including critical care time)  Medications Ordered in ED Medications  diclofenac (FLECTOR) 1.3 % 1 patch (1 patch Transdermal Patch Applied 12/16/17 1142)     Initial Impression / Assessment and Plan / ED Course  I have reviewed the triage vital signs and the nursing notes.  Pertinent labs & imaging results that were available during my care of the patient were reviewed by me and considered in my medical decision making (see chart for details).     12:24 PM Patient in no distress, upright, awake, alert, hemodynamically unremarkable. We discussed all findings, x-ray, CT performed for concern of occult rib fracture, with ongoing pain. She has received topical analgesia.  This well-appearing female presents after a fall with ongoing rib pain,  some concern of mild cognitive changes, but on exam has no focal neurologic deficiencies, is in no distress, there is low suspicion for intracranial substantial pathology beyond possible concussion. Patient also found not to have obvious fracture, though there is some suspicion for contusion versus disruption of calcium formation in the lower ribs. With reassuring vitals, imaging, patient discharged with ongoing topical analgesia, primary care follow-up.  Final Clinical Impressions(s) / ED Diagnoses  Fall, initial encounter   Gerhard Munch, MD 12/16/17 1225

## 2017-12-16 NOTE — Discharge Instructions (Addendum)
As discussed, your evaluation today has been largely reassuring.  But, it is important that you monitor your condition carefully, and do not hesitate to return to the ED if you develop new, or concerning changes in your condition.  Please obtain medication patches containing the ingredient dimethyl salicylate and use these for control of your chest wall pain.  [facility] is one brand).  Otherwise, please follow-up with your physician for appropriate ongoing care.

## 2018-07-04 ENCOUNTER — Emergency Department (HOSPITAL_COMMUNITY)
Admission: EM | Admit: 2018-07-04 | Discharge: 2018-07-04 | Disposition: A | Discharge status: Other Acute Inpatient Hospital | Payer: Medicare Other | Source: Home / Self Care | Attending: Emergency Medicine | Admitting: Emergency Medicine

## 2018-07-04 ENCOUNTER — Encounter (HOSPITAL_COMMUNITY): Payer: Self-pay | Admitting: Emergency Medicine

## 2018-07-04 ENCOUNTER — Inpatient Hospital Stay (HOSPITAL_COMMUNITY)
Admission: AD | Admit: 2018-07-04 | Discharge: 2018-07-05 | DRG: 885 | Disposition: A | Discharge status: Home / Self Care | Payer: Medicare Other | Source: Intra-hospital | Attending: Psychiatry | Admitting: Psychiatry

## 2018-07-04 ENCOUNTER — Encounter (HOSPITAL_COMMUNITY): Payer: Self-pay

## 2018-07-04 ENCOUNTER — Other Ambulatory Visit: Payer: Self-pay

## 2018-07-04 DIAGNOSIS — I10 Essential (primary) hypertension: Secondary | ICD-10-CM | POA: Diagnosis present

## 2018-07-04 DIAGNOSIS — Z90711 Acquired absence of uterus with remaining cervical stump: Secondary | ICD-10-CM

## 2018-07-04 DIAGNOSIS — Z86718 Personal history of other venous thrombosis and embolism: Secondary | ICD-10-CM

## 2018-07-04 DIAGNOSIS — F332 Major depressive disorder, recurrent severe without psychotic features: Principal | ICD-10-CM | POA: Diagnosis present

## 2018-07-04 DIAGNOSIS — E785 Hyperlipidemia, unspecified: Secondary | ICD-10-CM | POA: Diagnosis present

## 2018-07-04 DIAGNOSIS — K219 Gastro-esophageal reflux disease without esophagitis: Secondary | ICD-10-CM | POA: Diagnosis present

## 2018-07-04 DIAGNOSIS — F321 Major depressive disorder, single episode, moderate: Secondary | ICD-10-CM | POA: Diagnosis not present

## 2018-07-04 DIAGNOSIS — F32A Depression, unspecified: Secondary | ICD-10-CM

## 2018-07-04 DIAGNOSIS — F410 Panic disorder [episodic paroxysmal anxiety] without agoraphobia: Secondary | ICD-10-CM | POA: Diagnosis present

## 2018-07-04 DIAGNOSIS — R45851 Suicidal ideations: Secondary | ICD-10-CM | POA: Insufficient documentation

## 2018-07-04 DIAGNOSIS — Z79899 Other long term (current) drug therapy: Secondary | ICD-10-CM | POA: Insufficient documentation

## 2018-07-04 DIAGNOSIS — F411 Generalized anxiety disorder: Secondary | ICD-10-CM | POA: Insufficient documentation

## 2018-07-04 DIAGNOSIS — M797 Fibromyalgia: Secondary | ICD-10-CM | POA: Diagnosis present

## 2018-07-04 DIAGNOSIS — F419 Anxiety disorder, unspecified: Secondary | ICD-10-CM

## 2018-07-04 DIAGNOSIS — Z818 Family history of other mental and behavioral disorders: Secondary | ICD-10-CM | POA: Diagnosis not present

## 2018-07-04 DIAGNOSIS — F329 Major depressive disorder, single episode, unspecified: Secondary | ICD-10-CM

## 2018-07-04 LAB — CBC WITH DIFFERENTIAL/PLATELET
Abs Immature Granulocytes: 0.02 10*3/uL (ref 0.00–0.07)
Basophils Absolute: 0.1 10*3/uL (ref 0.0–0.1)
Basophils Relative: 1 %
Eosinophils Absolute: 0.1 10*3/uL (ref 0.0–0.5)
Eosinophils Relative: 1 %
HCT: 39.5 % (ref 36.0–46.0)
Hemoglobin: 12.9 g/dL (ref 12.0–15.0)
Immature Granulocytes: 0 %
Lymphocytes Relative: 20 %
Lymphs Abs: 1.4 10*3/uL (ref 0.7–4.0)
MCH: 30.9 pg (ref 26.0–34.0)
MCHC: 32.7 g/dL (ref 30.0–36.0)
MCV: 94.7 fL (ref 80.0–100.0)
Monocytes Absolute: 0.5 10*3/uL (ref 0.1–1.0)
Monocytes Relative: 8 %
Neutro Abs: 5.1 10*3/uL (ref 1.7–7.7)
Neutrophils Relative %: 70 %
Platelets: 273 10*3/uL (ref 150–400)
RBC: 4.17 MIL/uL (ref 3.87–5.11)
RDW: 12.1 % (ref 11.5–15.5)
WBC: 7.2 10*3/uL (ref 4.0–10.5)
nRBC: 0 % (ref 0.0–0.2)

## 2018-07-04 LAB — COMPREHENSIVE METABOLIC PANEL
ALT: 18 U/L (ref 0–44)
AST: 20 U/L (ref 15–41)
Albumin: 4.4 g/dL (ref 3.5–5.0)
Alkaline Phosphatase: 58 U/L (ref 38–126)
Anion gap: 7 (ref 5–15)
BUN: 19 mg/dL (ref 6–20)
CO2: 28 mmol/L (ref 22–32)
Calcium: 9.3 mg/dL (ref 8.9–10.3)
Chloride: 106 mmol/L (ref 98–111)
Creatinine, Ser: 0.87 mg/dL (ref 0.44–1.00)
GFR calc Af Amer: >60 mL/min (ref >60)
GFR calc non Af Amer: >60 mL/min (ref >60)
Glucose, Bld: 97 mg/dL (ref 70–99)
Potassium: 3.9 mmol/L (ref 3.5–5.1)
Sodium: 141 mmol/L (ref 135–145)
Total Bilirubin: 0.8 mg/dL (ref 0.3–1.2)
Total Protein: 6.9 g/dL (ref 6.5–8.1)

## 2018-07-04 LAB — ETHANOL: Alcohol, Ethyl (B): <10 mg/dL (ref <10)

## 2018-07-04 LAB — RAPID URINE DRUG SCREEN, HOSP PERFORMED
Amphetamines: NOT DETECTED
Barbiturates: NOT DETECTED
Benzodiazepines: POSITIVE — AB
Cocaine: NOT DETECTED
Opiates: NOT DETECTED
Tetrahydrocannabinol: NOT DETECTED

## 2018-07-04 LAB — POC URINE PREG, ED: Preg Test, Ur: NEGATIVE

## 2018-07-04 MED ORDER — ACETAMINOPHEN 325 MG PO TABS: 650.0000 mg | ORAL_TABLET | Freq: Four times a day (QID) | ORAL | Status: DC | PRN

## 2018-07-04 MED ORDER — ENSURE ENLIVE PO LIQD
237.0000 mL | Freq: Two times a day (BID) | ORAL | Status: DC
  Administered 2018-07-04: 237 mL via ORAL

## 2018-07-04 MED ORDER — MAGNESIUM HYDROXIDE 400 MG/5ML PO SUSP: 30.0000 mL | Freq: Every day | ORAL | Status: DC | PRN

## 2018-07-04 MED ORDER — SENNOSIDES-DOCUSATE SODIUM 8.6-50 MG PO TABS
2.0000 | ORAL_TABLET | Freq: Two times a day (BID) | ORAL | Status: DC
  Administered 2018-07-05: 2 via ORAL
  Filled 2018-07-04 (×5): qty 2

## 2018-07-04 MED ORDER — SPIRONOLACTONE 50 MG PO TABS
50.0000 mg | ORAL_TABLET | Freq: Every day | ORAL | Status: DC
  Administered 2018-07-05: 50 mg via ORAL
  Filled 2018-07-04 (×2): qty 1

## 2018-07-04 MED ORDER — BUPROPION HCL ER (XL) 300 MG PO TB24
450.0000 mg | ORAL_TABLET | Freq: Every day | ORAL | Status: DC
  Administered 2018-07-05: 450 mg via ORAL
  Filled 2018-07-04 (×2): qty 1

## 2018-07-04 MED ORDER — GABAPENTIN 300 MG PO CAPS
300.0000 mg | ORAL_CAPSULE | Freq: Two times a day (BID) | ORAL | Status: DC
  Administered 2018-07-04: 300 mg via ORAL
  Filled 2018-07-04 (×2): qty 1

## 2018-07-04 MED ORDER — ALPRAZOLAM 1 MG PO TABS: 1.0000 mg | ORAL_TABLET | Freq: Three times a day (TID) | ORAL | Status: DC | PRN

## 2018-07-04 MED ORDER — ADULT MULTIVITAMIN W/MINERALS CH
1.0000 | ORAL_TABLET | Freq: Every day | ORAL | Status: DC
  Administered 2018-07-05: 1 via ORAL
  Filled 2018-07-04 (×2): qty 1

## 2018-07-04 MED ORDER — ALPRAZOLAM 1 MG PO TABS
1.0000 mg | ORAL_TABLET | Freq: Four times a day (QID) | ORAL | Status: DC | PRN
  Administered 2018-07-04: 1 mg via ORAL
  Filled 2018-07-04: qty 1

## 2018-07-04 MED ORDER — HYDROXYZINE HCL 25 MG PO TABS
25.0000 mg | ORAL_TABLET | Freq: Three times a day (TID) | ORAL | Status: DC | PRN
  Administered 2018-07-05: 25 mg via ORAL
  Filled 2018-07-04: qty 1

## 2018-07-04 MED ORDER — TRAZODONE HCL 50 MG PO TABS
50.0000 mg | ORAL_TABLET | Freq: Every evening | ORAL | Status: DC | PRN
  Administered 2018-07-04: 50 mg via ORAL
  Filled 2018-07-04: qty 1

## 2018-07-04 MED ORDER — GABAPENTIN 300 MG PO CAPS
300.0000 mg | ORAL_CAPSULE | Freq: Three times a day (TID) | ORAL | Status: DC
  Administered 2018-07-05: 300 mg via ORAL
  Filled 2018-07-04 (×5): qty 1

## 2018-07-04 MED ORDER — LAMOTRIGINE 100 MG PO TABS
100.0000 mg | ORAL_TABLET | Freq: Two times a day (BID) | ORAL | Status: DC
  Administered 2018-07-05: 100 mg via ORAL
  Filled 2018-07-04 (×3): qty 1

## 2018-07-04 MED ORDER — SUMATRIPTAN SUCCINATE 50 MG PO TABS
50.0000 mg | ORAL_TABLET | Freq: Every day | ORAL | Status: DC | PRN
  Administered 2018-07-05: 50 mg via ORAL
  Filled 2018-07-04: qty 1

## 2018-07-04 MED ORDER — GABAPENTIN 300 MG PO CAPS: 300.0000 mg | ORAL_CAPSULE | Freq: Every day | ORAL | Status: DC

## 2018-07-04 MED ORDER — ALUM & MAG HYDROXIDE-SIMETH 200-200-20 MG/5ML PO SUSP: 30.0000 mL | ORAL | Status: DC | PRN

## 2018-07-04 MED ORDER — MIRTAZAPINE 30 MG PO TABS: 30.0000 mg | ORAL_TABLET | Freq: Every day | ORAL | Status: DC

## 2018-07-04 NOTE — ED Notes (Signed)
[facility] made aware of transport needed to [facility].  

## 2018-07-04 NOTE — ED Notes (Signed)
[id number] bed at 1430 [phone] call give report
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
## 2018-07-04 NOTE — ED Provider Notes (Addendum)
[facility]-EMERGENCY DEPT Provider Note   CSN: [id number] Arrival date & time: 07/04/18  1057    History   Chief Complaint Chief Complaint  Patient presents with  . Depression  . Anxiety    HPI [name] is a 58 y.o. female.     HPI   58 year old female with increasing depression.  She reports multiple stressors.  She states that her son is in prison and her father has dementia.  She has been crying a lot for the past several days.  She is followed by a psychiatrist.  She called them and they suggested therapy.  She is unable to obtain appointment for at least a few weeks.  She is having difficulty sleeping and concentrating.  She reports that she got lost is coming to the ER today but she could not think straight.  She wishes she would die but she has no specific plan to harm herself.  Denies any homicidal ideation.  She denies any hallucinations.  She denies any substance abuse.  Past Medical History:  Diagnosis Date  . Anemia   . Anxiety   . Chronic back pain   . Chronic constipation   . Depression   . DVT (deep venous thrombosis) (HCC)   . Fibromyalgia   . GERD (gastroesophageal reflux disease)   . Hayfever   . Hyperlipidemia   . Hypertension   . Internal hemorrhoids   . Left ureteral calculus   . Migraines   . Vitamin D deficiency     There are no active problems to display for this patient.   Past Surgical History:  Procedure Laterality Date  . BUNIONECTOMY  2009  . CESAREAN SECTION  1984  . PARTIAL HYSTERECTOMY  2001  . WISDOM TOOTH EXTRACTION       OB History   No obstetric history on file.      Home Medications    Prior to Admission medications   Medication Sig Start Date End Date Taking? Authorizing Provider  ALPRAZolam Prudy Feeler) 1 MG tablet Take 1 tablet by mouth 4 (four) times daily.  06/14/10   [provider]  buPROPion (WELLBUTRIN XL) 150 MG 24 hr tablet Take 450 mg by mouth daily. 11/20/17   [provider]  gabapentin (NEURONTIN) 600 MG tablet Take 1,200 mg by mouth at bedtime. 10/13/17   [provider]  lamoTRIgine (LAMICTAL) 100 MG tablet Take 100 mg by mouth 2 (two) times daily.      [provider]  mirtazapine (REMERON) 15 MG tablet Take 15 mg by mouth at bedtime. 11/20/17   [provider]  Multiple Vitamin (MULTIVITAMIN) tablet Take 1 tablet by mouth daily.      [provider]  senna-docusate (SENNA PLUS) 8.6-50 MG tablet Take 2 tablets by mouth 2 (two) times daily.    [provider]  spironolactone (ALDACTONE) 50 MG tablet Take 50 mg by mouth daily. 06/02/15   [provider]  SUMAtriptan (IMITREX) 50 MG tablet Take 50 mg by mouth daily as needed for migraine.     [provider]  traZODone (DESYREL) 100 MG tablet Take 100 mg by mouth at bedtime.      [provider]    Family History Family History  Problem Relation Age of Onset  . Osteoarthritis Mother   . Bipolar disorder Mother   . Hyperlipidemia Father   . Alzheimer's disease Father   . Hypertension Father   . Diabetes Maternal Grandmother   .  Stroke Maternal Grandfather   . Colon cancer Neg Hx     Social History Social History   Tobacco Use  . Smoking status: Never Smoker  . Smokeless tobacco: Never Used  Substance Use Topics  . Alcohol use: No  . Drug use: No     Allergies   Mirapex [pramipexole dihydrochloride]; Sulfa drugs cross reactors; and Penicillins   Review of Systems Review of Systems  All systems reviewed and negative, other than as noted in HPI.  Physical Exam Updated Vital Signs BP 126/86 (BP Location: Left Arm)   Pulse 70   Temp 98.3 F (36.8 C) (Oral)   Resp 18   SpO2 100%   Physical Exam Vitals signs and nursing note reviewed.  Constitutional:      General: She is not in acute distress.    Appearance: She is well-developed.  HENT:     Head: Normocephalic and atraumatic.  Eyes:     General:         Right eye: No discharge.        Left eye: No discharge.     Conjunctiva/sclera: Conjunctivae normal.  Neck:     Musculoskeletal: Neck supple.  Cardiovascular:     Rate and Rhythm: Normal rate and regular rhythm.     Heart sounds: Normal heart sounds. No murmur. No friction rub. No gallop.   Pulmonary:     Effort: Pulmonary effort is normal. No respiratory distress.     Breath sounds: Normal breath sounds.  Abdominal:     General: There is no distension.     Palpations: Abdomen is soft.     Tenderness: There is no abdominal tenderness.  Musculoskeletal:        General: No tenderness.  Skin:    General: Skin is warm and dry.  Neurological:     Mental Status: She is alert.  Psychiatric:        Thought Content: Thought content normal.     Comments: Speech clear.  Content appropriate.  Good eye contact.  Appears depressed.  Crying at times.  She is generally calm and cooperative though.      ED Treatments / Results  Labs (all labs ordered are listed, but only abnormal results are displayed) Labs Reviewed  RAPID URINE DRUG SCREEN, HOSP PERFORMED - Abnormal; Notable for the following components:      Result Value   Benzodiazepines POSITIVE (*)    All other components within normal limits  COMPREHENSIVE METABOLIC PANEL  ETHANOL  CBC WITH DIFFERENTIAL/PLATELET  POC URINE PREG, ED    EKG None  Radiology No results found.  Procedures Procedures (including critical care time)  Medications Ordered in ED Medications - No data to display   Initial Impression / Assessment and Plan / ED Course  I have reviewed the triage vital signs and the nursing notes.  Pertinent labs & imaging results that were available during my care of the patient were reviewed by me and considered in my medical decision making (see chart for details).  57yf with increasing depression and passive SI. She has been medically cleared for TTS assessment. Disposition per their recommendations.    Final Clinical Impressions(s) / ED Diagnoses   Final diagnoses:  Depression, unspecified depression type  Passive suicidal ideations    ED Discharge Orders    None        Raeford RazorKohut, Trichelle Lehan, MD 07/04/18 1251

## 2018-07-04 NOTE — ED Notes (Signed)
Telepsych machine set up in room for assessment.
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
## 2018-07-04 NOTE — [facility] Counselor (Signed)
Dr. Sharma Covert and Elta Guadeloupe, NP recommend in patient hospitalization. [facility] to review.

## 2018-07-04 NOTE — ED Notes (Signed)
Bed: [facility] Expected date:  Expected time:  Means of arrival:  Comments: 

## 2018-07-04 NOTE — Progress Notes (Addendum)
Pt accepted to [facility], Bed 402-1  Elta Guadeloupe, NP is the accepting provider.  Nehemiah Massed is the attending provider.  Call report to [phone]  [facility] notified.   Pt is Voluntary.  Pt may be transported by [facility]  Pt scheduled  to arrive at [facility] @14 :30  Carney Bern T. Kaylyn Lim, MSW, LCSWA Disposition Clinical Social Work [phone] (cell) [phone] (office)
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
## 2018-07-04 NOTE — ED Triage Notes (Signed)
Pt reports has a lot going on in her life and under a lot of stress. Reports that her son is in prison and her father has dementia. Been crying a lot and talked to a doctor this past weekend and cant get in for therapy for three week. Having problems thinking clearly.  Pt denies SI or HI. But states, "I wish God would just take me so I can get out of all this mess."

## 2018-07-05 DIAGNOSIS — F321 Major depressive disorder, single episode, moderate: Secondary | ICD-10-CM

## 2018-07-05 MED ORDER — GABAPENTIN 300 MG PO CAPS: 300.0000 mg | ORAL_CAPSULE | Freq: Three times a day (TID) | ORAL | 0 refills | Status: DC

## 2018-07-05 NOTE — BHH Suicide Risk Assessment (Signed)
[facility] INPATIENT:  Family/Significant Other Suicide Prevention Education  Suicide Prevention Education:   SPE completed with patient, as patient refused to consent to family contact. SPI pamphlet provided to pt and pt was encouraged to share information with support network, ask questions, and talk about any concerns relating to SPE. Patient denies access to guns/firearms and verbalized understanding of information provided. Mobile Crisis information also provided to patient.  [name], MSW, LCSWA Clinical Social Worker [facility]  Phone: [phone]  

## 2018-07-05 NOTE — Progress Notes (Signed)
Recreation Therapy Notes  Date:  5.1.20 Time: 0930 Location: [address] Dayroom  Group Topic: Stress Management  Goal Area(s) Addresses:  Patient will identify positive stress management techniques. Patient will identify benefits of using stress management post d/c.  Intervention:  Stress Management  Activity :  Meditation.  LRT introduced the stress management technique of meditation.  Meditation focused on making the most of the and the possibilities that come with it.  Patients were to follow along as meditation played in order to engage in activity.  Education:  Stress Management, Discharge Planning.   Education Outcome: Acknowledges Education  Clinical Observations/Feedback:  Pt did not attend group.    Ronya Gilcrest, LRT/CTRS         Savannah Erbe A 07/05/2018 10:57 AM 

## 2018-07-05 NOTE — Discharge Summary (Addendum)
Physician Discharge Summary Note  Patient:  [name] is an 58 y.o., female MRN:  [id number] DOB:  09-01-1960 Patient phone:  [phone] (home)  Patient address:   [address] [place] [address],  Total Time spent with patient: 15 minutes  Date of Admission:  07/04/2018 Date of Discharge: 07/05/18  Reason for Admission:  Suicidal ideation  Principal Problem: MDD (major depressive disorder), recurrent severe, without psychosis (HCC) Discharge Diagnoses: Principal Problem:   MDD (major depressive disorder), recurrent severe, without psychosis (HCC) Active Problems:   Current moderate episode of major depressive disorder without prior episode [facility])   Past Psychiatric History: Per admission H&P: States she has never attempted suicide, denies history of self injurious behaviors. Denies history of psychosis, denies history of mania or hypomania, describes history of PTSD , states symptoms have improved overtime. Reports history of panic attacks, but states has not had any " in a while". Reports increased anxiety , which she attributes mostly to her son's issues as above .  Reports one prior psychiatric admission in the 1980's for depression.   Past Medical History:  Past Medical History:  Diagnosis Date  . Anemia   . Anxiety   . Chronic back pain   . Chronic constipation   . Depression   . DVT (deep venous thrombosis) (HCC)   . Fibromyalgia   . GERD (gastroesophageal reflux disease)   . Hayfever   . Hyperlipidemia   . Hypertension   . Internal hemorrhoids   . Left ureteral calculus   . Migraines   . Vitamin D deficiency     Past Surgical History:  Procedure Laterality Date  . BUNIONECTOMY  2009  . CESAREAN SECTION  1984  . PARTIAL HYSTERECTOMY  2001  . WISDOM TOOTH EXTRACTION     Family History:  Family History  Problem Relation Age of Onset  . Osteoarthritis Mother   . Bipolar disorder Mother   . Hyperlipidemia Father   . Alzheimer's disease Father    . Hypertension Father   . Diabetes Maternal Grandmother   . Stroke Maternal Grandfather   . Colon cancer Neg Hx    Family Psychiatric  History: Per admission H&P: States mother had history of Bipolar Disorder .  No suicides in family. Son has history of substance abuse and suicidal attempts in the past . Social History:  Social History   Substance and Sexual Activity  Alcohol Use No     Social History   Substance and Sexual Activity  Drug Use No    Social History   Socioeconomic History  . Marital status: Widowed    Spouse name: Not on file  . Number of children: Not on file  . Years of education: Not on file  . Highest education level: Not on file  Occupational History  . Occupation: Disability  Social Needs  . Financial resource strain: Not on file  . Food insecurity:    Worry: Not on file    Inability: Not on file  . Transportation needs:    Medical: Not on file    Non-medical: Not on file  Tobacco Use  . Smoking status: Never Smoker  . Smokeless tobacco: Never Used  Substance and Sexual Activity  . Alcohol use: No  . Drug use: No  . Sexual activity: Not Currently  Lifestyle  . Physical activity:    Days per week: Not on file    Minutes per session: Not on file  . Stress: Not on file  Relationships  .  Social connections:    Talks on phone: Not on file    Gets together: Not on file    Attends religious service: Not on file    Active member of club or organization: Not on file    Attends meetings of clubs or organizations: Not on file    Relationship status: Not on file  Other Topics Concern  . Not on file  Social History Narrative  . Not on file    Hospital Course:  From admission H&P: Patient presented voluntarily to ED today. She reports she has been struggling with depression over the last several months, which she attributes to her son being incarcerated, and worries that he is using drugs there as he has been repeatedly asking her for money.  States  she has been afraid he may be seriously hurt in prison because he has mentioned gang activity. She denies suicidal plans or intention but reports intermittent passive SI " when I get really upset " such as thinking she would rather die. Endorses neuro-vegetative symptoms of depression as below.  Denies psychotic symptoms.  [name] was admitted for depression with passive SI. Xanax, Wellbutrin, Lamictal, trazodone and gabapentin were continued. She requested discharge soon after admission, saying she wanted outpatient therapy and did not feel the need to be inpatient. She remained on the [facility] unit for one day. She showed stable and appropriate mood, affect, sleep, appetite and interaction. She denies SI/HI/AVH and contracts for safety. She agrees to follow up at [facility] and [facility] (see below). Patient is provided with prescriptions for medications upon discharge. Her car is in the parking lot, and she is discharging home.   Physical Findings: AIMS:  , ,  ,  ,    CIWA:    COWS:     Musculoskeletal: Strength & Muscle Tone: within normal limits Gait & Station: normal Patient leans: N/A  Psychiatric Specialty Exam: Physical Exam  Nursing note and vitals reviewed. Constitutional: She is oriented to person, place, and time. She appears well-developed and well-nourished.  Cardiovascular: Normal rate.  Respiratory: Effort normal.  Neurological: She is alert and oriented to person, place, and time.    Review of Systems  Constitutional: Negative.   Respiratory: Negative for cough and shortness of breath.   Cardiovascular: Negative for chest pain.  Psychiatric/Behavioral: Positive for depression (improving). Negative for hallucinations, substance abuse and suicidal ideas. The patient is not nervous/anxious and does not have insomnia.     Blood pressure (!) 153/91, pulse 75, temperature 98.4 F (36.9 C), temperature source Oral,  resp. rate 16.There is no height or weight on file to calculate BMI.  See MD's discharge SRA     Have you used any form of tobacco in the last 30 days? (Cigarettes, Smokeless Tobacco, Cigars, and/or Pipes): No  Has this patient used any form of tobacco in the last 30 days? (Cigarettes, Smokeless Tobacco, Cigars, and/or Pipes)  No  Blood Alcohol level:  Lab Results  Component Value Date   ETH <10 07/04/2018    Metabolic Disorder Labs:  No results found for: HGBA1C, MPG No results found for: PROLACTIN No results found for: CHOL, TRIG, HDL, CHOLHDL, VLDL, LDLCALC  See Psychiatric Specialty Exam and Suicide Risk Assessment completed by Attending Physician prior to discharge.  Discharge destination:  Home  Is patient on multiple antipsychotic therapies at discharge:  No   Has Patient had three or more failed trials of antipsychotic monotherapy by history:  No  Recommended Plan for Multiple Antipsychotic Therapies: NA  Discharge Instructions    Discharge instructions   Complete by:  As directed    Patient is instructed to take all prescribed medications as recommended. Report any side effects or adverse reactions to your outpatient psychiatrist. Patient is instructed to abstain from alcohol and illegal drugs while on prescription medications. In the event of worsening symptoms, patient is instructed to call the crisis hotline, 911, or go to the nearest emergency department for evaluation and treatment.     Allergies as of 07/05/2018      Reactions   Mirapex [pramipexole Dihydrochloride] Nausea Only   Sulfa Drugs Cross Reactors Nausea Only   Penicillins Other (See Comments)   Yeast infection, nausea Has patient had a PCN reaction causing immediate rash, facial/tongue/throat swelling, SOB or lightheadedness with hypotension: No Has patient had a PCN reaction causing severe rash involving mucus membranes or skin necrosis: No Has patient had a PCN reaction that required  hospitalization No Has patient had a PCN reaction occurring within the last 10 years: No If all of the above answers are "NO", then may proceed wi      Medication List    STOP taking these medications   gabapentin 600 MG tablet Commonly known as:  NEURONTIN Replaced by:  gabapentin 300 MG capsule   mirtazapine 15 MG tablet Commonly known as:  REMERON   Senna Plus 8.6-50 MG tablet Generic drug:  senna-docusate     TAKE these medications     Indication  ALPRAZolam 1 MG tablet Commonly known as:  XANAX Take 1 tablet by mouth 4 (four) times daily.  Indication:  Feeling Anxious   buPROPion 150 MG 24 hr tablet Commonly known as:  WELLBUTRIN XL Take 450 mg by mouth daily.  Indication:  Major Depressive Disorder   gabapentin 300 MG capsule Commonly known as:  NEURONTIN Take 1 capsule (300 mg total) by mouth 3 (three) times daily. Replaces:  gabapentin 600 MG tablet  Indication:  Neuropathic Pain   Imitrex 50 MG tablet Generic drug:  SUMAtriptan Take 50 mg by mouth daily as needed for migraine.  Indication:  Migraine Headache   LaMICtal 100 MG tablet Generic drug:  lamoTRIgine Take 100 mg by mouth 2 (two) times daily.  Indication:  Mood   multivitamin tablet Take 1 tablet by mouth daily.  Indication:  Supplementation   spironolactone 50 MG tablet Commonly known as:  ALDACTONE Take 50 mg by mouth daily.  Indication:  Common Acne   traZODone 100 MG tablet Commonly known as:  DESYREL Take 100 mg by mouth at bedtime.  Indication:  Trouble Sleeping      Follow-up Information    [facility] Follow up on 07/08/2018.   Specialty:  Behavioral Health Why:  Medication management appointment with Misty StanleyLisa is Monday, 5/4 at 1:00p.  The appointment will be held over the phone and Misty StanleyLisa will contact you.  Contact information: [address] [place] [address] [phone]        [facility] Follow up.    Why:  Social Worker attempted to contact your therapist. Please call Sharrie RothmanMary Easton after discharge to schedule a therapy appointment.  Contact information: [address] [place] [address] [phone]           Follow-up recommendations: Activity as tolerated. Diet as recommended by primary care physician. Keep all scheduled follow-up appointments as recommended.   Comments:   Patient  is instructed to take all prescribed medications as recommended. Report any side effects or adverse reactions to your outpatient psychiatrist. Patient is instructed to abstain from alcohol and illegal drugs while on prescription medications. In the event of worsening symptoms, patient is instructed to call the crisis hotline, 911, or go to the nearest emergency department for evaluation and treatment.  Signed: Aldean Baker, NP 07/05/2018, 3:50 PM   Patient seen, Suicide Assessment Completed.  Disposition Plan Reviewed

## 2018-07-05 NOTE — Progress Notes (Signed)
Nutrition Brief Note  Patient identified on the Malnutrition Screening Tool (MST) Report  No recent weights available for this admission. Per records from [facility], pt's weight has increased. Pt has been ordered Ensure supplements. Pt reported today in [facility] assessment, no weight changes and good appetite.  Wt Readings from Last 15 Encounters:  12/16/17 56.2 kg  08/18/17 54.4 kg  08/15/17 54 kg  03/20/16 55.8 kg  06/29/10 58.1 kg  06/22/10 59 kg   Per records from [facility] 05/07/18: Height is documented as 5'6" . Weight was documented as 126 lb (57.2 kg).  BMI as of 3/3: 20.34 kg/m^2.  Patient meets criteria for normal based on current BMI.   Labs and medications reviewed.   No nutrition interventions warranted at this time. If nutrition issues arise, please consult RD.   Tilda Franco, MS, RD, LDN [facility] Inpatient Clinical Dietitian Pager: [phone] After Hours Pager: [phone]

## 2018-07-05 NOTE — Progress Notes (Signed)
D: Patient observed to be isolative to room much of the evening. Patient states, "I was reluctant to come in. I'm afraid my meds will be all switched up. I've been on xanax for years. I don't want to go into withdrawal." "I really don't even know that I need to be here, honestly." Patient's affect anxious with congruent mood.   Denies pain, physical complaints. COVID-19 screen negative, afebrile. Respiratory assessment WDL.  A: Medicated per orders, prn trazadone and xanax given per request and to promote sleep. Medication education provided. Level III obs in place for safety. Emotional support offered. Patient encouraged to complete Suicide Safety Plan before discharge. Encouraged to attend and participate in unit programming.   R: Patient verbalizes understanding of POC. On reassess, patient was asleep. Patient denies SI/HI/AVH and remains safe on level III obs. Will continue to monitor throughout the night.

## 2018-07-05 NOTE — Tx Team (Signed)
Interdisciplinary Treatment and Diagnostic Plan Update  07/05/2018 Time of Session:  [name] MRN: [id number]  Principal Diagnosis: <principal problem not specified>  Secondary Diagnoses: Active Problems:   MDD (major depressive disorder), recurrent severe, without psychosis (HCC)   Current Medications:  Current Facility-Administered Medications  Medication Dose Route Frequency Provider Last Rate Last Dose  . acetaminophen (TYLENOL) tablet 650 mg  650 mg Oral Q6H PRN Laveda Abbe, NP      . ALPRAZolam Prudy Feeler) tablet 1 mg  1 mg Oral Q6H PRN Cobos, Rockey Situ, MD   1 mg at 07/04/18 2102  . alum & mag hydroxide-simeth (MAALOX/MYLANTA) 200-200-20 MG/5ML suspension 30 mL  30 mL Oral Q4H PRN Laveda Abbe, NP      . buPROPion (WELLBUTRIN XL) 24 hr tablet 450 mg  450 mg Oral Daily Cobos, Rockey Situ, MD   450 mg at 07/05/18 0807  . feeding supplement (ENSURE ENLIVE) (ENSURE ENLIVE) liquid 237 mL  237 mL Oral BID BM Cobos, Rockey Situ, MD   237 mL at 07/04/18 1828  . gabapentin (NEURONTIN) capsule 300 mg  300 mg Oral TID Cobos, Rockey Situ, MD   300 mg at 07/05/18 0807  . hydrOXYzine (ATARAX/VISTARIL) tablet 25 mg  25 mg Oral TID PRN Laveda Abbe, NP   25 mg at 07/05/18 0034  . lamoTRIgine (LAMICTAL) tablet 100 mg  100 mg Oral BID Cobos, Rockey Situ, MD   100 mg at 07/05/18 0807  . magnesium hydroxide (MILK OF MAGNESIA) suspension 30 mL  30 mL Oral Daily PRN Laveda Abbe, NP      . multivitamin with minerals tablet 1 tablet  1 tablet Oral Daily Cobos, Rockey Situ, MD   1 tablet at 07/05/18 0807  . senna-docusate (Senokot-S) tablet 2 tablet  2 tablet Oral BID Laveda Abbe, NP   2 tablet at 07/05/18 9774  . spironolactone (ALDACTONE) tablet 50 mg  50 mg Oral Daily Cobos, Rockey Situ, MD   50 mg at 07/05/18 0808  . SUMAtriptan (IMITREX) tablet 50 mg  50 mg Oral Daily PRN Laveda Abbe, NP      . traZODone (DESYREL) tablet 50 mg  50 mg Oral QHS PRN  Cobos, Rockey Situ, MD   50 mg at 07/04/18 2102   PTA Medications: Facility-Administered Medications Prior to Admission  Medication Dose Route Frequency Provider Last Rate Last Dose  . 0.9 %  sodium chloride infusion  500 mL Intravenous Continuous Meryl Dare, MD       Medications Prior to Admission  Medication Sig Dispense Refill Last Dose  . ALPRAZolam (XANAX) 1 MG tablet Take 1 tablet by mouth 4 (four) times daily.    12/16/2017 at Unknown time  . buPROPion (WELLBUTRIN XL) 150 MG 24 hr tablet Take 450 mg by mouth daily.   12/16/2017 at Unknown time  . gabapentin (NEURONTIN) 600 MG tablet Take 1,200 mg by mouth at bedtime.   12/15/2017 at Unknown time  . lamoTRIgine (LAMICTAL) 100 MG tablet Take 100 mg by mouth 2 (two) times daily.     12/16/2017 at Unknown time  . mirtazapine (REMERON) 15 MG tablet Take 15 mg by mouth at bedtime.   12/15/2017 at Unknown time  . Multiple Vitamin (MULTIVITAMIN) tablet Take 1 tablet by mouth daily.     12/16/2017 at Unknown time  . senna-docusate (SENNA PLUS) 8.6-50 MG tablet Take 2 tablets by mouth 2 (two) times daily.   12/16/2017 at Unknown time  .  spironolactone (ALDACTONE) 50 MG tablet Take 50 mg by mouth daily.   12/16/2017 at Unknown time  . SUMAtriptan (IMITREX) 50 MG tablet Take 50 mg by mouth daily as needed for migraine.    12/15/2017 at Unknown time  . traZODone (DESYREL) 100 MG tablet Take 100 mg by mouth at bedtime.     12/15/2017 at Unknown time    Patient Stressors: Marital or family conflict Traumatic event  Patient Strengths: Ability for insight Average or above average intelligence Capable of independent living  Treatment Modalities: Medication Management, Group therapy, Case management,  1 to 1 session with clinician, Psychoeducation, Recreational therapy.   Physician Treatment Plan for Primary Diagnosis: <principal problem not specified> Long Term Goal(s): Improvement in symptoms so as ready for discharge Improvement in  symptoms so as ready for discharge   Short Term Goals: Ability to identify changes in lifestyle to reduce recurrence of condition will improve Ability to verbalize feelings will improve Ability to disclose and discuss suicidal ideas Ability to demonstrate self-control will improve Ability to identify and develop effective coping behaviors will improve Ability to maintain clinical measurements within normal limits will improve Ability to identify changes in lifestyle to reduce recurrence of condition will improve Ability to verbalize feelings will improve Ability to disclose and discuss suicidal ideas Ability to demonstrate self-control will improve Ability to identify and develop effective coping behaviors will improve Ability to maintain clinical measurements within normal limits will improve  Medication Management: Evaluate patient's response, side effects, and tolerance of medication regimen.  Therapeutic Interventions: 1 to 1 sessions, Unit Group sessions and Medication administration.  Evaluation of Outcomes: Adequate for Discharge  Physician Treatment Plan for Secondary Diagnosis: Active Problems:   MDD (major depressive disorder), recurrent severe, without psychosis (HCC)  Long Term Goal(s): Improvement in symptoms so as ready for discharge Improvement in symptoms so as ready for discharge   Short Term Goals: Ability to identify changes in lifestyle to reduce recurrence of condition will improve Ability to verbalize feelings will improve Ability to disclose and discuss suicidal ideas Ability to demonstrate self-control will improve Ability to identify and develop effective coping behaviors will improve Ability to maintain clinical measurements within normal limits will improve Ability to identify changes in lifestyle to reduce recurrence of condition will improve Ability to verbalize feelings will improve Ability to disclose and discuss suicidal ideas Ability to demonstrate  self-control will improve Ability to identify and develop effective coping behaviors will improve Ability to maintain clinical measurements within normal limits will improve     Medication Management: Evaluate patient's response, side effects, and tolerance of medication regimen.  Therapeutic Interventions: 1 to 1 sessions, Unit Group sessions and Medication administration.  Evaluation of Outcomes: Adequate for Discharge   RN Treatment Plan for Primary Diagnosis: <principal problem not specified> Long Term Goal(s): Knowledge of disease and therapeutic regimen to maintain health will improve  Short Term Goals: Ability to participate in decision making will improve, Ability to verbalize feelings will improve, Ability to disclose and discuss suicidal ideas and Ability to identify and develop effective coping behaviors will improve  Medication Management: RN will administer medications as ordered by provider, will assess and evaluate patient's response and provide education to patient for prescribed medication. RN will report any adverse and/or side effects to prescribing provider.  Therapeutic Interventions: 1 on 1 counseling sessions, Psychoeducation, Medication administration, Evaluate responses to treatment, Monitor vital signs and CBGs as ordered, Perform/monitor CIWA, COWS, AIMS and Fall Risk screenings as ordered, Perform wound  care treatments as ordered.  Evaluation of Outcomes: Adequate for Discharge   LCSW Treatment Plan for Primary Diagnosis: <principal problem not specified> Long Term Goal(s): Safe transition to appropriate next level of care at discharge, Engage patient in therapeutic group addressing interpersonal concerns.  Short Term Goals: Engage patient in aftercare planning with referrals and resources  Therapeutic Interventions: Assess for all discharge needs, 1 to 1 time with Social worker, Explore available resources and support systems, Assess for adequacy in community  support network, Educate family and significant other(s) on suicide prevention, Complete Psychosocial Assessment, Interpersonal group therapy.  Evaluation of Outcomes: Adequate for Discharge   Progress in Treatment: Attending groups: No. New to unit  Participating in groups: No. Taking medication as prescribed: Yes. Toleration medication: Yes. Family/Significant other contact made: No, will contact:  if patient consents to collateral contacts Patient understands diagnosis: Yes. Discussing patient identified problems/goals with staff: Yes. Medical problems stabilized or resolved: Yes. Denies suicidal/homicidal ideation: Yes. Issues/concerns per patient self-inventory: No. Other:   New problem(s) identified: None   New Short Term/Long Term Goal(s):medication stabilization, elimination of SI thoughts, development of comprehensive mental wellness plan.    Patient Goals:    Discharge Plan or Barriers: Patient discharged home and will continue to follow up with her providers at [facility] for medication management and [facility] for therapy services.   Reason for Continuation of Hospitalization: None   Estimated Length of Stay: 07/05/2018  Attendees: Patient: 07/05/2018 8:26 AM  Physician: Dr. Nehemiah Massed, MD 07/05/2018 8:26 AM  Nursing: Jan.W, RN 07/05/2018 8:26 AM  RN Care Manager: 07/05/2018 8:26 AM  Social Worker: Baldo Daub, LCSWA 07/05/2018 8:26 AM  Recreational Therapist:  07/05/2018 8:26 AM  Other:  07/05/2018 8:26 AM  Other:  07/05/2018 8:26 AM  Other: 07/05/2018 8:26 AM    Scribe for Treatment Team: Maeola Sarah, LCSWA 07/05/2018 8:26 AM

## 2018-07-05 NOTE — BHH Suicide Risk Assessment (Signed)
[facility] Discharge Suicide Risk Assessment   Principal Problem: Depression Discharge Diagnoses: Active Problems:   MDD (major depressive disorder), recurrent severe, without psychosis (HCC)   Total Time spent with patient: 30 minutes  Musculoskeletal: Strength & Muscle Tone: within normal limits Gait & Station: normal Patient leans: N/A  Psychiatric Specialty Exam: ROS no fever, no chills, no shortness of breath, no cough, no rash  Blood pressure (!) 153/91, pulse 75, temperature 98.4 F (36.9 C), temperature source Oral, resp. rate 16.There is no height or weight on file to calculate BMI.  General Appearance: Well Groomed  Eye Contact::  Good  Speech:  Normal Rate409  Volume:  Normal  Mood:  reports " my mood is OK", presents euthymic   Affect:  Appropriate and reactive  Thought Process:  Linear and Descriptions of Associations: Intact  Orientation:  Full (Time, Place, and Person)  Thought Content:  no hallucinations, no delusions, not internally preoccupied   Suicidal Thoughts:  No denies suicidal or self injurious ideations, denies homicidal or violent ideations  Homicidal Thoughts:  No  Memory:  recent and remote grossly intact   Judgement:  Other:  fair- improving  Insight:  Fair/ improving  Psychomotor Activity:  Normal- no tremors, no diaphoresis, no psychomotor agitation or restlessness   Concentration:  Good  Recall:  Good  Fund of Knowledge:Fair  Language: Good  Akathisia:  Negative  Handed:  Right  AIMS (if indicated):     Assets:  Desire for Improvement Resilience  Sleep:  Number of Hours: 1.25  Cognition: WNL  ADL's:  Intact   Mental Status Per Nursing Assessment::   On Admission:  NA  Demographic Factors:  Widowed, lives alone, on disability, has a 69 year old son,   Loss Factors: Concerns about her son, who is currently incarcerated and has history of substance abuse   Historical Factors: History of depression and of PTSD . No history of suicide  attempts  Risk Reduction Factors:   Sense of responsibility to family and Positive coping skills or problem solving skills  Continued Clinical Symptoms:  At this time patient is alert, attentive, well groomed, good eye contact, mood presents euthymic, affect is appropriate and reactive, no thought disorder, no suicidal or self injurious ideations, no homicidal or violent ideations, no hallucinations, no delusions  , future oriented . Denies medication side effects- she is not presenting with symptoms of WDL- no tremors , no diaphoresis, no psychomotor agitation or restlessness  Behavior on unit calm, in good control.  Patient is requesting discharge. Reports  " I do not think I need to be inpatient, all I wanted was to see a therapist soon ".  She plans to continue treatment with Dr Lance Coon for outpatient psychiatric management and states she has medications at home. We have reviewed potential  medication side effects, including  potential for sedation and drug drug interactions. We also reviewed addictive/dependence potential related to BZDs and possible risks associated with long term BZD use . Patient is aware of potential for BZD WDL if stopped abruptly.  Patient advised not to drive if feeling drowsy or sedated .     Cognitive Features That Contribute To Risk:  No gross cognitive deficits noted upon discharge. Is alert , attentive, and oriented x 3   Suicide Risk:  Mild:  Suicidal ideation of limited frequency, intensity, duration, and specificity.  There are no identifiable plans, no associated intent, mild dysphoria and related symptoms, good self-control (both objective and subjective assessment), few other  risk factors, and identifiable protective factors, including available and accessible social support.  Follow-up Information    [facility] Follow up on 07/08/2018.   Specialty:  Behavioral Health Why:  Medication management appointment with Misty StanleyLisa is Monday,  5/4 at 1:00p.  The appointment will be held over the phone and Misty StanleyLisa will contact you.  Contact information: [address] [place] [address] [phone]        [facility] Follow up.   Contact information: [address] [place] [address] [phone]           Plan Of Care/Follow-up recommendations:  Activity:  as tolerated  Diet:  regular Tests:  NA Other:  See below Patient is requesting discharge and there are no current grounds for involuntary commitment . She is leaving unit in good spirits, plans to return home. Plans to follow up with Dr. Lance CoonPoulos at [facility] for medication management and with Sharrie RothmanMary Easton for therapy. Plans to follow up with PCP , Dr. Arther DamesLisa Kisner , for medical management as needed .   Craige CottaFernando A Tanaysia Bhardwaj, MD 07/05/2018, 10:30 AM

## 2018-07-05 NOTE — Progress Notes (Signed)
Pt D/C from the hospital. Security escorted [facility] to [facility] to [facility] up her car. All items returned. D/C instructions given and suicide information given. Pt denies si and hi.

## 2018-07-05 NOTE — BHH Counselor (Signed)
Patient discharged within 24 hours of admission. PSA not required.    Baldo Daub, MSW, LCSWA Clinical Social Worker [facility]  Phone: [phone]

## 2018-07-05 NOTE — Progress Notes (Signed)
  [facility] Adult Case Management Discharge Plan :  Will you be returning to the same living situation after discharge:  Yes,  patient reports she is returning home  At discharge, do you have transportation home?: Yes,  patient reports her car is in the [facility] parking lot Do you have the ability to pay for your medications: Yes,  [employer]  Release of information consent forms completed and in the chart;  Patient's signature needed at discharge.  Patient to Follow up at: Follow-up Information    [facility] Follow up on 07/08/2018.   Specialty:  Behavioral Health Why:  Medication management appointment with Misty Stanley is Monday, 5/4 at 1:00p.  The appointment will be held over the phone and Misty Stanley will contact you.  Contact information: [address] [place] [address] [phone]        [facility] Follow up.   Why:  Social Worker attempted to contact your therapist. Please call Sharrie Rothman after discharge to schedule a therapy appointment.  Contact information: [address] [place] [address] [phone]           Next level of care provider has access to [facility] Link:yes  Safety Planning and Suicide Prevention discussed: Yes,  with the patient   Have you used any form of tobacco in the last 30 days? (Cigarettes, Smokeless Tobacco, Cigars, and/or Pipes): No  Has patient been referred to the Quitline?: N/A patient is not a smoker  Patient has been referred for addiction treatment: N/A  Maeola Sarah, LCSWA 07/05/2018, 1:11 PM

## 2019-07-10 ENCOUNTER — Ambulatory Visit: Payer: Medicare Other

## 2019-07-15 ENCOUNTER — Ambulatory Visit: Payer: Medicare Other | Attending: Internal Medicine

## 2019-07-15 DIAGNOSIS — Z23 Encounter for immunization: Secondary | ICD-10-CM

## 2019-07-15 NOTE — Progress Notes (Signed)
   Covid-19 Vaccination Clinic  Name:  [name]    MRN: [id number] DOB: [birth year]  07/15/2019  [name] was observed post Covid-19 immunization for 15 minutes without incident. She was provided with Vaccine Information Sheet and instruction to access the V-Safe system.   [name] was instructed to call 911 with any severe reactions post vaccine: Marland Kitchen Difficulty breathing  . Swelling of face and throat  . A fast heartbeat  . A bad rash all over body  . Dizziness and weakness   Immunizations Administered    Name Date Dose VIS Date Route   Pfizer COVID-19 Vaccine 07/15/2019  9:48 AM 0.3 mL 04/30/2018 Intramuscular   Manufacturer: [employer], [employer]   Lot: [id number]   NDC: [address]-2

## 2019-08-05 ENCOUNTER — Ambulatory Visit: Payer: Self-pay

## 2020-05-25 ENCOUNTER — Other Ambulatory Visit: Payer: Self-pay

## 2020-05-25 ENCOUNTER — Encounter (HOSPITAL_COMMUNITY): Payer: Self-pay

## 2020-05-25 ENCOUNTER — Emergency Department (HOSPITAL_COMMUNITY)
Admission: EM | Admit: 2020-05-25 | Discharge: 2020-05-25 | Disposition: A | Discharge status: Home / Self Care | Payer: Medicare Other | Attending: Emergency Medicine | Admitting: Emergency Medicine

## 2020-05-25 ENCOUNTER — Emergency Department (HOSPITAL_COMMUNITY): Payer: Medicare Other

## 2020-05-25 DIAGNOSIS — K5900 Constipation, unspecified: Secondary | ICD-10-CM | POA: Insufficient documentation

## 2020-05-25 DIAGNOSIS — I10 Essential (primary) hypertension: Secondary | ICD-10-CM | POA: Insufficient documentation

## 2020-05-25 LAB — URINALYSIS, ROUTINE W REFLEX MICROSCOPIC
Bilirubin Urine: NEGATIVE
Glucose, UA: NEGATIVE mg/dL
Hgb urine dipstick: NEGATIVE
Ketones, ur: NEGATIVE mg/dL
Leukocytes,Ua: NEGATIVE
Nitrite: NEGATIVE
Protein, ur: NEGATIVE mg/dL
Specific Gravity, Urine: 1.01 (ref 1.005–1.030)
pH: 7.5 (ref 5.0–8.0)

## 2020-05-25 LAB — CBC
HCT: 43.1 % (ref 36.0–46.0)
Hemoglobin: 14.3 g/dL (ref 12.0–15.0)
MCH: 31.5 pg (ref 26.0–34.0)
MCHC: 33.2 g/dL (ref 30.0–36.0)
MCV: 94.9 fL (ref 80.0–100.0)
Platelets: 302 10*3/uL (ref 150–400)
RBC: 4.54 MIL/uL (ref 3.87–5.11)
RDW: 12 % (ref 11.5–15.5)
WBC: 5.5 10*3/uL (ref 4.0–10.5)
nRBC: 0 % (ref 0.0–0.2)

## 2020-05-25 LAB — COMPREHENSIVE METABOLIC PANEL
ALT: 16 U/L (ref 0–44)
AST: 23 U/L (ref 15–41)
Albumin: 4.5 g/dL (ref 3.5–5.0)
Alkaline Phosphatase: 56 U/L (ref 38–126)
Anion gap: 9 (ref 5–15)
BUN: 13 mg/dL (ref 6–20)
CO2: 30 mmol/L (ref 22–32)
Calcium: 9.7 mg/dL (ref 8.9–10.3)
Chloride: 103 mmol/L (ref 98–111)
Creatinine, Ser: 1.16 mg/dL — ABNORMAL HIGH (ref 0.44–1.00)
GFR, Estimated: 54 mL/min — ABNORMAL LOW (ref >60)
Glucose, Bld: 92 mg/dL (ref 70–99)
Potassium: 4.1 mmol/L (ref 3.5–5.1)
Sodium: 142 mmol/L (ref 135–145)
Total Bilirubin: 0.6 mg/dL (ref 0.3–1.2)
Total Protein: 7.2 g/dL (ref 6.5–8.1)

## 2020-05-25 LAB — LIPASE, BLOOD: Lipase: 50 U/L (ref 11–51)

## 2020-05-25 MED ORDER — LORAZEPAM 1 MG PO TABS
1.0000 mg | ORAL_TABLET | Freq: Once | ORAL | Status: AC
  Administered 2020-05-25: 1 mg via ORAL
  Filled 2020-05-25: qty 1

## 2020-05-25 MED ORDER — IOHEXOL 300 MG/ML  SOLN
100.0000 mL | Freq: Once | INTRAMUSCULAR | Status: AC | PRN
  Administered 2020-05-25: 100 mL via INTRAVENOUS

## 2020-05-25 NOTE — ED Provider Notes (Addendum)
[facility]-EMERGENCY DEPT Provider Note   CSN: [id number] Arrival date & time: 05/25/20  5188     History Chief Complaint  Patient presents with  . Constipation    [name] is a 61 y.o. female.  The history is provided by the patient.  Constipation Severity:  Moderate Time since last bowel movement:  2 weeks Timing:  Constant Progression:  Worsening Chronicity:  New Context comment:  No BM in 1.5 weeks and feels constipated Stool description:  None produced Relieved by:  Nothing Worsened by:  Nothing Ineffective treatments:  Laxatives, stool softeners and fiber Associated symptoms: abdominal pain (left lower)   Associated symptoms: no back pain, no dysuria, no fever, no nausea and no vomiting   Risk factors: no hx of abdominal surgery        Past Medical History:  Diagnosis Date  . Anemia   . Anxiety   . Chronic back pain   . Chronic constipation   . Depression   . DVT (deep venous thrombosis) (HCC)   . Fibromyalgia   . GERD (gastroesophageal reflux disease)   . Hayfever   . Hyperlipidemia   . Hypertension   . Internal hemorrhoids   . Left ureteral calculus   . Migraines   . Vitamin D deficiency     Patient Active Problem List   Diagnosis Date Noted  . Current moderate episode of major depressive disorder without prior episode (HCC)   . MDD (major depressive disorder), recurrent severe, without psychosis (HCC) 07/04/2018    Past Surgical History:  Procedure Laterality Date  . BUNIONECTOMY  2009  . CESAREAN SECTION  1984  . PARTIAL HYSTERECTOMY  2001  . WISDOM TOOTH EXTRACTION       OB History   No obstetric history on file.     Family History  Problem Relation Age of Onset  . Osteoarthritis Mother   . Bipolar disorder Mother   . Hyperlipidemia Father   . Alzheimer's disease Father   . Hypertension Father   . Diabetes Maternal Grandmother   . Stroke Maternal Grandfather   . Colon cancer Neg Hx     Social  History   Tobacco Use  . Smoking status: Never Smoker  . Smokeless tobacco: Never Used  Vaping Use  . Vaping Use: Never used  Substance Use Topics  . Alcohol use: No  . Drug use: No    Home Medications Prior to Admission medications   Medication Sig Start Date End Date Taking? Authorizing Provider  ALPRAZolam Prudy Feeler) 1 MG tablet Take 1 tablet by mouth 4 (four) times daily.  06/14/10   [provider]  buPROPion (WELLBUTRIN XL) 150 MG 24 hr tablet Take 450 mg by mouth daily. 11/20/17   [provider]  gabapentin (NEURONTIN) 300 MG capsule Take 1 capsule (300 mg total) by mouth 3 (three) times daily. 07/05/18   Aldean Baker, NP  lamoTRIgine (LAMICTAL) 100 MG tablet Take 100 mg by mouth 2 (two) times daily.      [provider]  Multiple Vitamin (MULTIVITAMIN) tablet Take 1 tablet by mouth daily.      [provider]  spironolactone (ALDACTONE) 50 MG tablet Take 50 mg by mouth daily. 06/02/15   [provider]  SUMAtriptan (IMITREX) 50 MG tablet Take 50 mg by mouth daily as needed for migraine.     [provider]  traZODone (DESYREL) 100 MG tablet Take 100 mg by mouth at bedtime.  [provider]    Allergies    Mirapex [pramipexole dihydrochloride], Sulfa drugs cross reactors, and Penicillins  Review of Systems   Review of Systems  Constitutional: Negative for chills and fever.  HENT: Negative for ear pain and sore throat.   Eyes: Negative for pain and visual disturbance.  Respiratory: Negative for cough and shortness of breath.   Cardiovascular: Negative for chest pain and palpitations.  Gastrointestinal: Positive for abdominal pain (left lower) and constipation. Negative for nausea and vomiting.  Genitourinary: Negative for dysuria and hematuria.  Musculoskeletal: Negative for arthralgias and back pain.  Skin: Negative for color change and rash.  Neurological: Negative for seizures and syncope.  All other  systems reviewed and are negative.   Physical Exam Updated Vital Signs BP 132/82   Pulse 73   Temp 97.6 F (36.4 C) (Oral)   Resp 15   Ht 5\' 6"  (1.676 m)   Wt 59 kg   SpO2 97%   BMI 20.98 kg/m   Physical Exam Vitals and nursing note reviewed.  Constitutional:      General: She is not in acute distress.    Appearance: She is well-developed.  HENT:     Head: Normocephalic and atraumatic.  Eyes:     Conjunctiva/sclera: Conjunctivae normal.  Cardiovascular:     Rate and Rhythm: Normal rate and regular rhythm.     Heart sounds: No murmur heard.   Pulmonary:     Effort: Pulmonary effort is normal. No respiratory distress.     Breath sounds: Normal breath sounds.  Abdominal:     Palpations: Abdomen is soft.     Tenderness: There is no abdominal tenderness.  Genitourinary:    Comments: Rectal exam revealed stool in the rectal vault with no evidence of impaction Musculoskeletal:     Cervical back: Neck supple.  Skin:    General: Skin is warm and dry.  Neurological:     General: No focal deficit present.     Mental Status: She is alert.  Psychiatric:        Mood and Affect: Mood normal.        Behavior: Behavior normal.     ED Results / Procedures / Treatments   Labs (all labs ordered are listed, but only abnormal results are displayed) Labs Reviewed  COMPREHENSIVE METABOLIC PANEL - Abnormal; Notable for the following components:      Result Value   Creatinine, Ser 1.16 (*)    GFR, Estimated 54 (*)    All other components within normal limits  LIPASE, BLOOD  CBC  URINALYSIS, ROUTINE W REFLEX MICROSCOPIC    EKG None  Radiology CT Abdomen Pelvis W Contrast  Result Date: 05/25/2020 CLINICAL DATA:  Diverticulitis, abdominal pain and cramping, constipation EXAM: CT ABDOMEN AND PELVIS WITH CONTRAST TECHNIQUE: Multidetector CT imaging of the abdomen and pelvis was performed using the standard protocol following bolus administration of intravenous contrast.  CONTRAST:  05/27/2020 OMNIPAQUE IOHEXOL 300 MG/ML  SOLN COMPARISON:  None. FINDINGS: Lower chest: Minimal left basilar atelectasis. The visualized heart and pericardium are unremarkable. Hepatobiliary: Several small cystic lesions are identified within the left hepatic lobe, too small to accurately characterize on this examination. Mild hepatic steatosis. The liver is otherwise unremarkable. No intra or extrahepatic biliary ductal dilation. Pancreas: Unremarkable Spleen: Unremarkable Adrenals/Urinary Tract: The adrenal glands are unremarkable. The kidneys are normal in size and position. Several tiny cortical cysts are seen within the kidneys bilaterally. No enhancing intrarenal masses. No hydronephrosis. No intrarenal or  ureteral calculi. The bladder is unremarkable. Stomach/Bowel: There is large stool seen throughout the colon and within the rectal vault without evidence of obstruction. The stomach, small bowel, and large bowel are otherwise unremarkable. Appendix normal. No free intraperitoneal gas. Vascular/Lymphatic: There is mild aortoiliac atherosclerotic calcification. No aortic aneurysm. No pathologic adenopathy within the abdomen and pelvis. Reproductive: Mild simple appearing free fluid within the pelvis is nonspecific, but abnormal in the postmenopausal patient. Uterus absent. No adnexal masses. Other: The rectum is unremarkable. Tiny fat containing umbilical hernia. Musculoskeletal: Degenerative changes are seen within the a lumbar spine. No lytic or blastic bone lesions are identified. No acute bone abnormality. There are mild inflammatory changes noted along the left gluteal crease, best seen on image # 89/2 without associated subcutaneous fluid collection identified. The inflammatory changes appear superficial to the subjacent gluteal musculature. IMPRESSION: Large stool throughout the colon without evidence of obstruction. Mild free fluid within the pelvis, nonspecific, but abnormal in the postmenopausal  patient. Mild subcutaneous inflammatory stranding subjacent to the left gluteal crease Aortic Atherosclerosis (ICD10-I70.0). Electronically Signed   By: Helyn Numbers MD   On: 05/25/2020 10:10    Procedures Procedures   Medications Ordered in ED Medications - No data to display  ED Course  I have reviewed the triage vital signs and the nursing notes.  Pertinent labs & imaging results that were available during my care of the patient were reviewed by me and considered in my medical decision making (see chart for details).    MDM Rules/Calculators/A&P                          The patient is 60 years old.  She presents with left lower quadrant abdominal pain and constipation.  She has had lifelong issues with constipation, and she has not had a bowel movement in 1.5 weeks.  She does have an appointment with her primary care doctor and plans to start taking Linzess.  She was evaluated here in the ED for any evidence of intra-abdominal pathology.  She did not respond well to a soapsuds enema.  She passed only scant stool.  I did attempt a manual disimpaction, but she does not have an impacted rectum.  Stool is beyond the point of my finger reaching and is very soft.  I think she is still amenable to laxatives, and she was counseled to take magnesium citrate at home. Final Clinical Impression(s) / ED Diagnoses Final diagnoses:  Constipation, unspecified constipation type    Rx / DC Orders ED Discharge Orders    None       Koleen Distance, MD 05/25/20 1323    Koleen Distance, MD 05/25/20 1328

## 2020-05-25 NOTE — ED Notes (Signed)
Pt produced 2 quarter-sized BM's and fluid from suds enema.

## 2020-05-25 NOTE — ED Notes (Signed)
Pt on bedside commode after administration of of soap suds enema

## 2020-05-25 NOTE — ED Triage Notes (Signed)
Patient c/o constipation and states no BM in 1 1/2 weeks.

## 2020-05-25 NOTE — ED Notes (Signed)
Gave patient [place] sandwich/ water

## 2020-05-25 NOTE — Discharge Instructions (Addendum)
Take magnesium citrate today. Use according to the label's directions.

## 2020-11-12 ENCOUNTER — Telehealth (HOSPITAL_COMMUNITY): Payer: Self-pay | Admitting: Cardiology

## 2020-11-12 NOTE — Telephone Encounter (Signed)
Pt called to f/u w/referral from Dr Alta Corning, [facility], please advise

## 2020-11-18 NOTE — Telephone Encounter (Signed)
Pt was advised we have not received a referral

## 2020-11-25 ENCOUNTER — Telehealth (HOSPITAL_COMMUNITY): Payer: Self-pay | Admitting: *Deleted

## 2020-11-25 NOTE — Telephone Encounter (Signed)
Pt left vm asking if we received referral. After reviewing referral with Dinah Beers it was decided that pt is not a candidate for our clinic. Pt does not have heart failure and referral needs to be sent to [facility] heart care. Dr.McLean [facility].  Pt aware and thanked me for the call. Referral faxed to [facility] [phone].

## 2020-12-20 ENCOUNTER — Encounter: Payer: Self-pay | Admitting: Cardiology

## 2021-04-07 ENCOUNTER — Telehealth: Payer: Self-pay

## 2021-04-07 NOTE — Telephone Encounter (Signed)
NOTES SCANNED TO REFERRAL 

## 2021-04-29 ENCOUNTER — Ambulatory Visit: Payer: Medicare Other | Admitting: Cardiovascular Disease

## 2021-04-29 NOTE — Progress Notes (Unsigned)
No chief complaint on file.  History of Present Illness: 61 yo female with history of anxiety, depression, fibromyalgia, GERD, HTN, hyperlipidemia, migraines and DVT here today as a new consult for the evaluation of ***  Primary Care Physician: Thomes Dinning, MD   Past Medical History:  Diagnosis Date   Anemia    Anxiety    Chronic back pain    Chronic constipation    Depression    DVT (deep venous thrombosis) (HCC)    Fibromyalgia    GERD (gastroesophageal reflux disease)    Hayfever    Hyperlipidemia    Hypertension    Internal hemorrhoids    Left ureteral calculus    Migraines    Vitamin D deficiency     Past Surgical History:  Procedure Laterality Date   BUNIONECTOMY  2009   CESAREAN SECTION  1984   PARTIAL HYSTERECTOMY  2001   WISDOM TOOTH EXTRACTION      Current Outpatient Medications  Medication Sig Dispense Refill   ALPRAZolam (XANAX) 1 MG tablet Take 1 mg by mouth 4 (four) times daily.     buPROPion (WELLBUTRIN XL) 150 MG 24 hr tablet Take 450 mg by mouth daily.     FERREX 150 150 MG capsule Take 150 mg by mouth daily.     gabapentin (NEURONTIN) 300 MG capsule Take 1 capsule (300 mg total) by mouth 3 (three) times daily. (Patient taking differently: Take 600 mg by mouth at bedtime.) 90 capsule 0   lamoTRIgine (LAMICTAL) 100 MG tablet Take 100 mg by mouth 2 (two) times daily.     mirtazapine (REMERON) 30 MG tablet Take 30 mg by mouth at bedtime.     Multiple Vitamin (MULTIVITAMIN) tablet Take 1 tablet by mouth every evening.     SENNA S 8.6-50 MG tablet Take 4-5 tablets by mouth See admin instructions. Take 4 tablets by mouth in the morning, and 5 tablets at night     spironolactone (ALDACTONE) 50 MG tablet Take 50 mg by mouth daily.     SUMAtriptan (IMITREX) 50 MG tablet Take 50 mg by mouth daily as needed for migraine.     traZODone (DESYREL) 100 MG tablet Take 100 mg by mouth at bedtime.     valbenazine (INGREZZA) 80 MG capsule Take 80 mg by mouth at  bedtime.     No current facility-administered medications for this visit.    Allergies  Allergen Reactions   Mirapex [Pramipexole Dihydrochloride] Nausea Only   Sulfa Drugs Cross Reactors Nausea Only   Penicillins Other (See Comments)    Yeast infection, nausea Has patient had a PCN reaction causing immediate rash, facial/tongue/throat swelling, SOB or lightheadedness with hypotension: No Has patient had a PCN reaction causing severe rash involving mucus membranes or skin necrosis: No Has patient had a PCN reaction that required hospitalization No Has patient had a PCN reaction occurring within the last 10 years: No If all of the above answers are "NO", then may proceed wi    Social History   Socioeconomic History   Marital status: Widowed    Spouse name: Not on file   Number of children: Not on file   Years of education: Not on file   Highest education level: Not on file  Occupational History   Occupation: Disability  Tobacco Use   Smoking status: Never   Smokeless tobacco: Never  Vaping Use   Vaping Use: Never used  Substance and Sexual Activity   Alcohol use: No   Drug  use: No   Sexual activity: Not Currently  Other Topics Concern   Not on file  Social History Narrative   Not on file   Social Determinants of Health   Financial Resource Strain: Not on file  Food Insecurity: Not on file  Transportation Needs: Not on file  Physical Activity: Not on file  Stress: Not on file  Social Connections: Not on file  Intimate Partner Violence: Not on file    Family History  Problem Relation Age of Onset   Osteoarthritis Mother    Bipolar disorder Mother    Hyperlipidemia Father    Alzheimer's disease Father    Hypertension Father    Diabetes Maternal Grandmother    Stroke Maternal Grandfather    Colon cancer Neg Hx     Review of Systems:  As stated in the HPI and otherwise negative.   There were no vitals taken for this visit.  Physical  Examination: General: Well developed, well nourished, NAD  HEENT: OP clear, mucus membranes moist  SKIN: warm, dry. No rashes. Neuro: No focal deficits  Musculoskeletal: Muscle strength 5/5 all ext  Psychiatric: Mood and affect normal  Neck: No JVD, no carotid bruits, no thyromegaly, no lymphadenopathy.  Lungs:Clear bilaterally, no wheezes, rhonci, crackles Cardiovascular: Regular rate and rhythm. No murmurs, gallops or rubs. Abdomen:Soft. Bowel sounds present. Non-tender.  Extremities: No lower extremity edema. Pulses are 2 + in the bilateral DP/PT.  EKG:  EKG {ACTION; IS/IS BPZ:[id number]} ordered today. The ekg ordered today demonstrates ***  Recent Labs: 05/25/2020: ALT 16; BUN 13; Creatinine, Ser 1.16; Hemoglobin 14.3; Platelets 302; Potassium 4.1; Sodium 142   Lipid Panel No results found for: CHOL, TRIG, HDL, CHOLHDL, VLDL, LDLCALC, LDLDIRECT   Wt Readings from Last 3 Encounters:  05/25/20 130 lb (59 kg)  12/16/17 124 lb (56.2 kg)  08/18/17 120 lb (54.4 kg)     Other studies Reviewed: Additional studies/ records that were reviewed today include: ***. Review of the above records demonstrates: ***   Assessment and Plan:   1.   Current medicines are reviewed at length with the patient today.  The patient {ACTIONS; HAS/DOES NOT HAVE:19233} concerns regarding medicines.  The following changes have been made:  {PLAN; NO CHANGE:13088:s}  Labs/ tests ordered today include: *** No orders of the defined types were placed in this encounter.    Disposition:   F/U with *** in {gen number 8-24:235361} {TIME; UNITS DAY/WEEK/MONTH:19136}   Signed, Verne Carrow, MD 04/29/2021 6:11 AM    [facility] [address] [place], [place], [place]  [address] Phone: [phone]; Fax: [phone]

## 2021-05-19 NOTE — Progress Notes (Signed)
? ?Chief Complaint  ?Patient presents with  ? New Patient (Initial Visit)  ?  Palpitations  ? ?History of Present Illness: 61 yo female with history of anemia, anxiety, depression, prior DVT, fibromyalgia, GERD, HTN, hyperlipidemia and migraines here today as a new patient for the evaluation of palpitations. She began to have palpitations one month ago. This lasted for up to 12 hours. She had some dizziness. No recurrence over the past 3 weeks. She had been followed in [facility] but has not been seen there in 1 year. No chest pain.  ? ?Primary Care Physician: Thomes Dinning, MD ? ?Past Medical History:  ?Diagnosis Date  ? Anemia   ? Anxiety   ? Chronic back pain   ? Chronic constipation   ? Depression   ? DVT (deep venous thrombosis) (HCC)   ? Fibromyalgia   ? GERD (gastroesophageal reflux disease)   ? Hayfever   ? Hyperlipidemia   ? Hypertension   ? Internal hemorrhoids   ? Left ureteral calculus   ? Migraines   ? Vitamin D deficiency   ? ? ?Past Surgical History:  ?Procedure Laterality Date  ? BUNIONECTOMY  2009  ? CESAREAN SECTION  1984  ? PARTIAL HYSTERECTOMY  2001  ? WISDOM TOOTH EXTRACTION    ? ? ?Current Outpatient Medications  ?Medication Sig Dispense Refill  ? ALPRAZolam (XANAX) 1 MG tablet Take 1 mg by mouth 4 (four) times daily.    ? buPROPion (WELLBUTRIN XL) 150 MG 24 hr tablet Take 450 mg by mouth daily.    ? D2000 ULTRA STRENGTH 50 MCG (2000 UT) CAPS Take 1 tablet by mouth daily.    ? FERREX 150 150 MG capsule Take 150 mg by mouth daily.    ? gabapentin (NEURONTIN) 300 MG capsule Take 1 capsule (300 mg total) by mouth 3 (three) times daily. (Patient taking differently: Take 600 mg by mouth at bedtime.) 90 capsule 0  ? lamoTRIgine (LAMICTAL) 100 MG tablet Take 100 mg by mouth 2 (two) times daily.    ? mirtazapine (REMERON) 30 MG tablet Take 30 mg by mouth at bedtime.    ? SENNA S 8.6-50 MG tablet Take 4-5 tablets by mouth See admin instructions. Take 4 tablets by mouth in the morning, and 5  tablets at night    ? spironolactone (ALDACTONE) 50 MG tablet Take 50 mg by mouth daily.    ? SUMAtriptan (IMITREX) 50 MG tablet Take 50 mg by mouth daily as needed for migraine.    ? traZODone (DESYREL) 100 MG tablet Take 100 mg by mouth at bedtime.    ? valbenazine (INGREZZA) 80 MG capsule Take 80 mg by mouth at bedtime.    ? Multiple Vitamin (MULTIVITAMIN) tablet Take 1 tablet by mouth every evening. (Patient not taking: Reported on 05/20/2021)    ? ?No current facility-administered medications for this visit.  ? ? ?Allergies  ?Allergen Reactions  ? Mirapex [Pramipexole Dihydrochloride] Nausea Only  ? Sulfa Drugs Cross Reactors Nausea Only  ? Penicillins Other (See Comments)  ?  Yeast infection, nausea ?Has patient had a PCN reaction causing immediate rash, facial/tongue/throat swelling, SOB or lightheadedness with hypotension: No ?Has patient had a PCN reaction causing severe rash involving mucus membranes or skin necrosis: No ?Has patient had a PCN reaction that required hospitalization No ?Has patient had a PCN reaction occurring within the last 10 years: No ?If all of the above answers are "NO", then may proceed wi  ? ? ?Social  History  ? ?Socioeconomic History  ? Marital status: Widowed  ?  Spouse name: Not on file  ? Number of children: 1  ? Years of education: Not on file  ? Highest education level: Not on file  ?Occupational History  ? Occupation: Disability  ? Occupation: Disabliity  ?Tobacco Use  ? Smoking status: Never  ? Smokeless tobacco: Never  ?Vaping Use  ? Vaping Use: Never used  ?Substance and Sexual Activity  ? Alcohol use: No  ? Drug use: No  ? Sexual activity: Not Currently  ?Other Topics Concern  ? Not on file  ?Social History Narrative  ? Not on file  ? ?Social Determinants of Health  ? ?Financial Resource Strain: Not on file  ?Food Insecurity: Not on file  ?Transportation Needs: Not on file  ?Physical Activity: Not on file  ?Stress: Not on file  ?Social Connections: Not on file  ?Intimate  Partner Violence: Not on file  ? ? ?Family History  ?Problem Relation Age of Onset  ? Osteoarthritis Mother   ? Bipolar disorder Mother   ? Dementia Mother   ? Hyperlipidemia Father   ? Alzheimer's disease Father   ? Hypertension Father   ? Diabetes Maternal Grandmother   ? Stroke Maternal Grandfather   ? Colon cancer Neg Hx   ? ? ?Review of Systems:  As stated in the HPI and otherwise negative.  ? ?BP 110/72   Pulse 69   Ht 5' 6.5" (1.689 m)   Wt 132 lb (59.9 kg)   SpO2 96%   BMI 20.99 kg/m?  ? ?Physical Examination: ?General: Well developed, well nourished, NAD  ?HEENT: OP clear, mucus membranes moist  ?SKIN: warm, dry. No rashes. ?Neuro: No focal deficits  ?Musculoskeletal: Muscle strength 5/5 all ext  ?Psychiatric: Mood and affect normal  ?Neck: No JVD, no carotid bruits, no thyromegaly, no lymphadenopathy.  ?Lungs:Clear bilaterally, no wheezes, rhonci, crackles ?Cardiovascular: Regular rate and rhythm. No murmurs, gallops or rubs. ?Abdomen:Soft. Bowel sounds present. Non-tender.  ?Extremities: No lower extremity edema. Pulses are 2 + in the bilateral DP/PT. ? ?EKG:  EKG is ordered today. ?The ekg ordered today demonstrates sinus ? ?Recent Labs: ?05/25/2020: ALT 16; BUN 13; Creatinine, Ser 1.16; Hemoglobin 14.3; Platelets 302; Potassium 4.1; Sodium 142  ? ?Lipid Panel ?No results found for: CHOL, TRIG, HDL, CHOLHDL, VLDL, LDLCALC, LDLDIRECT ?  ?Wt Readings from Last 3 Encounters:  ?05/20/21 132 lb (59.9 kg)  ?05/25/20 130 lb (59 kg)  ?12/16/17 124 lb (56.2 kg)  ?  ? ? ?Assessment and Plan:  ? ?1. Palpitations/dizziness/dyspnea: Will arrange an echo to assess LVEF and exclude structural heart disease. Will arrange 30 day monitor.  ? ?Labs/ tests ordered today include:  ? ?Orders Placed This Encounter  ?Procedures  ? LONG TERM MONITOR (3-14 DAYS)  ? EKG 12-Lead  ? ECHOCARDIOGRAM COMPLETE  ? ?Disposition:   F/U with me or office APP in 6-8 weeks ? ?Signed, ?Verne Carrow, MD ?05/20/2021 9:02 AM    ?[facility] ?[address] [place], [place], [place]  [address] ?Phone: [phone]; Fax: [phone]  ? ? ?

## 2021-05-20 ENCOUNTER — Other Ambulatory Visit: Payer: Self-pay

## 2021-05-20 ENCOUNTER — Encounter: Payer: Self-pay | Admitting: Cardiovascular Disease

## 2021-05-20 ENCOUNTER — Ambulatory Visit (INDEPENDENT_AMBULATORY_CARE_PROVIDER_SITE_OTHER): Payer: Medicare Other

## 2021-05-20 ENCOUNTER — Ambulatory Visit: Payer: Medicare Other | Admitting: Cardiovascular Disease

## 2021-05-20 VITALS — BP 110/72 | HR 69 | Ht 66.5 in | Wt 132.0 lb

## 2021-05-20 DIAGNOSIS — R0609 Other forms of dyspnea: Secondary | ICD-10-CM

## 2021-05-20 DIAGNOSIS — R002 Palpitations: Secondary | ICD-10-CM | POA: Diagnosis not present

## 2021-05-20 NOTE — Progress Notes (Unsigned)
Applied a 14 day Zio XT monitor to patient in the office ?

## 2021-05-20 NOTE — Patient Instructions (Signed)
Medication Instructions:  ?No changes ?*If you need a refill on your cardiac medications before your next appointment, please call your pharmacy* ? ? ?Lab Work: ?none ?If you have labs (blood work) drawn today and your tests are completely normal, you will receive your results only by: ?MyChart Message (if you have MyChart) OR ?A paper copy in the mail ?If you have any lab test that is abnormal or we need to change your treatment, we will call you to review the results. ? ? ?Testing/Procedures: ?Your physician has requested that you have an echocardiogram. Echocardiography is a painless test that uses sound waves to create images of your heart. It provides your doctor with information about the size and shape of your heart and how well your heart?s chambers and valves are working. This procedure takes approximately one hour. There are no restrictions for this procedure. ? ?Cardiac monitor -patch - 14 days ? ? ?Follow-Up: ?At [facility], you and your health needs are our priority.  As part of our continuing mission to provide you with exceptional heart care, we have created designated Provider Care Teams.  These Care Teams include your primary Cardiologist (physician) and Advanced Practice Providers (APPs -  Physician Assistants and Nurse Practitioners) who all work together to provide you with the care you need, when you need it. ? ? ?Your next appointment:   ?6-8 week(s) ? ?The format for your next appointment:   ?In Person ? ?Provider:   ?Verne Carrow, MD or Advanced Practice Provider ? ? ?Other Instructions ?ZIO XT- Long Term Monitor Instructions ? ?Your physician has requested you wear a ZIO patch monitor for 14 days.  ?This is a single patch monitor. [employer] supplies one patch monitor per enrollment. Additional ?stickers are not available. Please do not apply patch if you will be having a Nuclear Stress Test,  ?Echocardiogram, Cardiac CT, MRI, or Chest Xray during the period you would be wearing the   ?monitor. The patch cannot be worn during these tests. You cannot remove and re-apply the  ?ZIO XT patch monitor.  ?Your ZIO patch monitor will be mailed 3 day USPS to your address on file. It may take 3-5 days  ?to receive your monitor after you have been enrolled.  ?Once you have received your monitor, please review the enclosed instructions. Your monitor  ?has already been registered assigning a specific monitor serial # to you. ? ?Billing and Patient Assistance Program Information ? ?We have supplied [employer] with any of your insurance information on file for billing purposes. ?[employer] offers a sliding scale Patient Assistance Program for patients that do not have  ?insurance, or whose insurance does not completely cover the cost of the ZIO monitor.  ?You must apply for the Patient Assistance Program to qualify for this discounted rate.  ?To apply, please call [employer] at [phone], select option 4, select option 2, ask to apply for  ?Patient Assistance Program. Meredeth Ide will ask your household income, and how many people  ?are in your household. They will quote your out-of-pocket cost based on that information.  ?[employer] will also be able to set up a 13-month, interest-free payment plan if needed. ? ?Applying the monitor ?  ?Shave hair from upper left chest.  ?Hold abrader disc by orange tab. Rub abrader in 40 strokes over the upper left chest as  ?indicated in your monitor instructions.  ?Clean area with 4 enclosed alcohol pads. Let dry.  ?Apply patch as indicated in monitor instructions. Patch will be placed  under collarbone on left  ?side of chest with arrow pointing upward.  ?Rub patch adhesive wings for 2 minutes. Remove white label marked "1". Remove the white  ?label marked "2". Rub patch adhesive wings for 2 additional minutes.  ?While looking in a mirror, press and release button in center of patch. A small green light will  ?flash 3-4 times. This will be your only indicator that the monitor has been  turned on.  ?Do not shower for the first 24 hours. You may shower after the first 24 hours.  ?Press the button if you feel a symptom. You will hear a small click. Record Date, Time and  ?Symptom in the Patient Logbook.  ?When you are ready to remove the patch, follow instructions on the last 2 pages of Patient  ?Logbook. Stick patch monitor onto the last page of Patient Logbook.  ?Place Patient Logbook in the blue and white box. Use locking tab on box and tape box closed  ?securely. The blue and white box has prepaid postage on it. Please place it in the mailbox as  ?soon as possible. Your physician should have your test results approximately 7 days after the  ?monitor has been mailed back to [facility].  ?Call [facility] at [phone] if you have questions regarding  ?your ZIO XT patch monitor. Call them immediately if you see an orange light blinking on your  ?monitor.  ?If your monitor falls off in less than 4 days, contact our Monitor department at [phone].  ?If your monitor becomes loose or falls off after 4 days call [employer] at [phone] for  ?suggestions on securing your monitor. ? ?  ?

## 2021-06-06 ENCOUNTER — Ambulatory Visit (HOSPITAL_COMMUNITY): Payer: Medicare Other | Attending: Cardiology

## 2021-06-06 DIAGNOSIS — R0609 Other forms of dyspnea: Secondary | ICD-10-CM | POA: Insufficient documentation

## 2021-06-06 DIAGNOSIS — R002 Palpitations: Secondary | ICD-10-CM | POA: Diagnosis not present

## 2021-06-06 LAB — ECHOCARDIOGRAM COMPLETE
Area-P 1/2: 3.33 cm2
S' Lateral: 2.7 cm

## 2021-06-15 ENCOUNTER — Telehealth: Payer: Self-pay | Admitting: *Deleted

## 2021-06-15 MED ORDER — DILTIAZEM HCL 30 MG PO TABS
ORAL_TABLET | ORAL | 1 refills | Status: DC
Start: 2021-06-15 — End: 2023-07-05

## 2021-06-15 NOTE — Telephone Encounter (Signed)
-----   Message from Kathleene Hazel, MD sent at 06/08/2021 12:16 PM EDT ----- ?Sinus with 5 beat run of SVT. Her palpitations are due to either PVCs or SVT. We can give her Diltiazem 30 mg tablets to use as needed if her heart is racing and review vagal maneuvers for SVT. Thanks, chris ?

## 2021-06-15 NOTE — Telephone Encounter (Signed)
Reviewed with patient.  She voices understanding of recommendations.  If heart racing she will take one diltiazem and do vagal maneuvers.  Adv use diltiazem every 6 hrs as needed and if one tab and vagal maneuvers do not work that she should call our office for further recommendations on using sooner doses of diltiazem.  Aware of f/u with APP in May. ?

## 2021-06-29 NOTE — Progress Notes (Deleted)
Cardiology Office Note    Date:  06/29/2021   ID:  [name], DOB Oct 09, 1960, MRN [id number]   PCP:  Thomes Dinning, MD   [facility]  Cardiologist:  None *** Advanced Practice Provider:  No care team member to display Electrophysiologist:  None   [id number]}   No chief complaint on file.   History of Present Illness:  [name] is a 61 y.o. female with history of anemia, anxiety, depression, prior DVT, fibromyalgia, GERD, HTN, hyperlipidemia and migraines.  Patient was previously followed by [facility] cardiology.  She saw Dr. Clifton James 05/20/2021 for palpitations lasting up to 12 hours.  Monitor showed 1 run of SVT lasting 5 beats.  She was given diltiazem 30 mg as needed.  End vagal maneuvers were reviewed.  2D echo 06/06/2021 normal LVEF, moderate pneumonia valve regurgitation.   Past Medical History:  Diagnosis Date   Anemia    Anxiety    Chronic back pain    Chronic constipation    Depression    DVT (deep venous thrombosis) (HCC)    Fibromyalgia    GERD (gastroesophageal reflux disease)    Hayfever    Hyperlipidemia    Hypertension    Internal hemorrhoids    Left ureteral calculus    Migraines    Vitamin D deficiency     Past Surgical History:  Procedure Laterality Date   BUNIONECTOMY  2009   CESAREAN SECTION  1984   PARTIAL HYSTERECTOMY  2001   WISDOM TOOTH EXTRACTION      Current Medications: No outpatient medications have been marked as taking for the 07/06/21 encounter (Appointment) with Dyann Kief, PA-C.     Allergies:   Mirapex [pramipexole dihydrochloride], Sulfa drugs cross reactors, and Penicillins   Social History   Socioeconomic History   Marital status: Widowed    Spouse name: Not on file   Number of children: 1   Years of education: Not on file   Highest education level: Not on file  Occupational History   Occupation: Disability   Occupation: Disabliity  Tobacco Use   Smoking status: Never    Smokeless tobacco: Never  Vaping Use   Vaping Use: Never used  Substance and Sexual Activity   Alcohol use: No   Drug use: No   Sexual activity: Not Currently  Other Topics Concern   Not on file  Social History Narrative   Not on file   Social Determinants of Health   Financial Resource Strain: Not on file  Food Insecurity: Not on file  Transportation Needs: Not on file  Physical Activity: Not on file  Stress: Not on file  Social Connections: Not on file     Family History:  The patient's ***family history includes Alzheimer's disease in her father; Bipolar disorder in her mother; Dementia in her mother; Diabetes in her maternal grandmother; Hyperlipidemia in her father; Hypertension in her father; Osteoarthritis in her mother; Stroke in her maternal grandfather.   ROS:   Please see the history of present illness.    ROS All other systems reviewed and are negative.   PHYSICAL EXAM:   VS:  There were no vitals taken for this visit.  Physical Exam  GEN: Well nourished, well developed, in no acute distress  HEENT: normal  Neck: no JVD, carotid bruits, or masses Cardiac:RRR; no murmurs, rubs, or gallops  Respiratory:  clear to auscultation bilaterally, normal work of breathing GI: soft, nontender, nondistended, + BS Ext: without  cyanosis, clubbing, or edema, Good distal pulses bilaterally MS: no deformity or atrophy  Skin: warm and dry, no rash Neuro:  Alert and Oriented x 3, Strength and sensation are intact Psych: euthymic mood, full affect  Wt Readings from Last 3 Encounters:  05/20/21 132 lb (59.9 kg)  05/25/20 130 lb (59 kg)  12/16/17 124 lb (56.2 kg)      Studies/Labs Reviewed:   EKG:  EKG is*** ordered today.  The ekg ordered today demonstrates ***  Recent Labs: No results found for requested labs within last 8760 hours.   Lipid Panel No results found for: CHOL, TRIG, HDL, CHOLHDL, VLDL, LDLCALC, LDLDIRECT  Additional studies/ records that were reviewed  today include:  2D echo 06/06/2021 IMPRESSIONS     1. Left ventricular ejection fraction, by estimation, is 65 to 70%. Left  ventricular ejection fraction by 3D volume is 67 %. The left ventricle has  normal function. The left ventricle has no regional wall motion  abnormalities. Left ventricular diastolic   parameters are indeterminate. The average left ventricular global  longitudinal strain is 21.6 %. The global longitudinal strain is normal.   2. Right ventricular systolic function is normal. The right ventricular  size is normal. There is normal pulmonary artery systolic pressure. The  estimated right ventricular systolic pressure is 22.4 mmHg.   3. The mitral valve is normal in structure. Trivial mitral valve  regurgitation. No evidence of mitral stenosis.   4. The aortic valve is tricuspid. Aortic valve regurgitation is not  visualized. Aortic valve sclerosis is present, with no evidence of aortic  valve stenosis.   5. Pulmonic valve regurgitation is moderate.   6. The inferior vena cava is normal in size with greater than 50%  respiratory variability, suggesting right atrial pressure of 3 mmHg.    14-day monitor 06/08/2021 Patch Wear Time:  11 days and 7 hours (2023-03-17T08:50:08-398 to 2023-03-28T16:48:59-0400)   Sinus rhythm. (Minimum heart rate of 47 bpm, maximum heart rate of 126 bpm, and avg HR of 70 bpm).  1 run of Supraventricular Tachycardia occurred lasting 5 beats.  Isolated premature ventricular contractions.    Risk Assessment/Calculations:   {Does this patient have ATRIAL FIBRILLATION?[phone]}     ASSESSMENT:    No diagnosis found.   PLAN:  In order of problems listed above:  Palpitations most likely due to SVT and PVCs seen on monitor was given as needed diltiazem.  Vagal maneuvers were reviewed 2D echo normal LVEF  Hypertension  Hyperlipidemia  History of DVT   Shared Decision Making/Informed Consent   {Are you ordering a CV Procedure  (e.g. stress test, cath, DCCV, TEE, etc)?   Press F2        :[id number]}     Medication Adjustments/Labs and Tests Ordered: Current medicines are reviewed at length with the patient today.  Concerns regarding medicines are outlined above.  Medication changes, Labs and Tests ordered today are listed in the Patient Instructions below. There are no Patient Instructions on file for this visit.   Elson Clan, PA-C  06/29/2021 2:38 PM    [facility] [address] [place], [place], [place]  [address] Phone: [phone]; Fax: [phone]

## 2021-07-06 ENCOUNTER — Ambulatory Visit: Payer: Medicare Other | Admitting: Physician Assistant

## 2021-07-06 DIAGNOSIS — E785 Hyperlipidemia, unspecified: Secondary | ICD-10-CM

## 2021-07-06 DIAGNOSIS — R002 Palpitations: Secondary | ICD-10-CM

## 2021-07-06 DIAGNOSIS — I10 Essential (primary) hypertension: Secondary | ICD-10-CM

## 2022-03-12 IMAGING — CT CT ABD-PELV W/ CM
2 of 5 series · 15 of 46 positions shown, 17 images · IV contrast (OMNIPAQUE 300)
Comparison: None.

CLINICAL DATA: Diverticulitis, abdominal pain and cramping,
constipation

EXAM:
CT ABDOMEN AND PELVIS WITH CONTRAST
TECHNIQUE: Multidetector CT imaging of the abdomen and pelvis was performed
using the standard protocol following bolus administration of
intravenous contrast.
CONTRAST:  100mL OMNIPAQUE IOHEXOL 300 MG/ML  SOLN

[Series 2: axial st · axial · 0.70mm/px · z∈[-506,-116]mm · 12 of 92 slices shown, 14 images]
[im 7/92  soft-tissue]
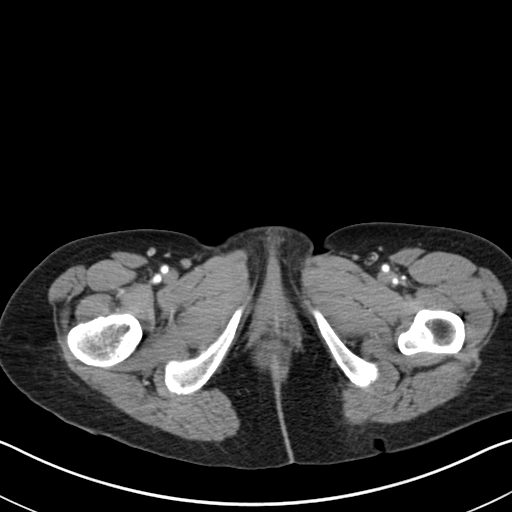
[im 7/92  bone]
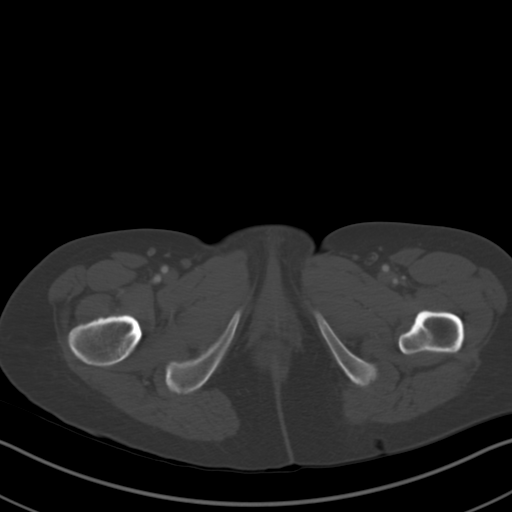
[im 14/92  soft-tissue]
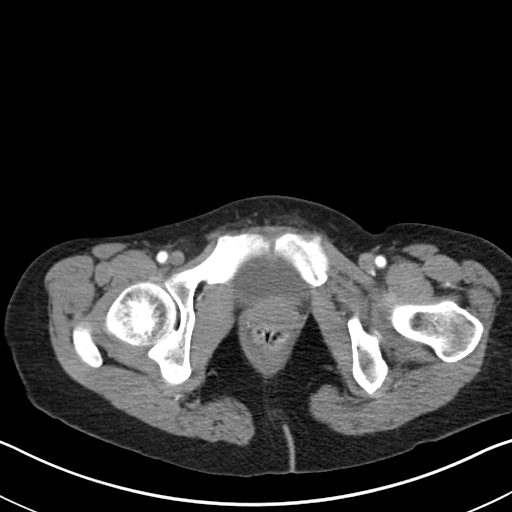
[im 20/92  soft-tissue]
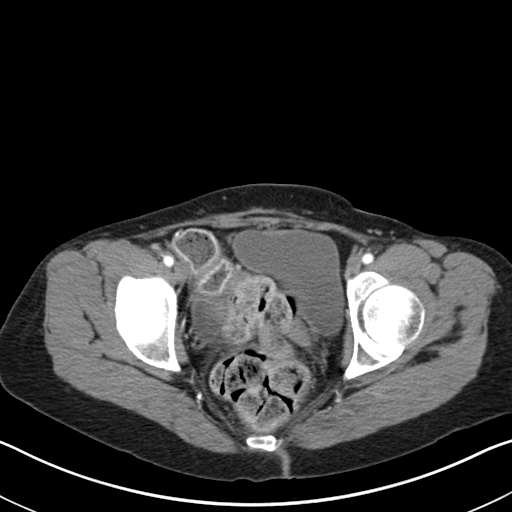
[im 27/92  soft-tissue]
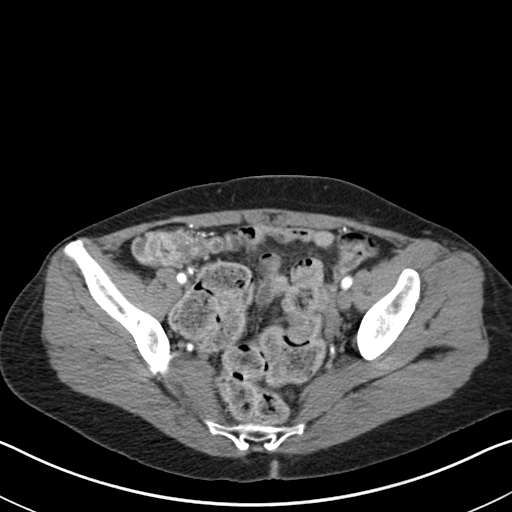
[im 33/92  soft-tissue]
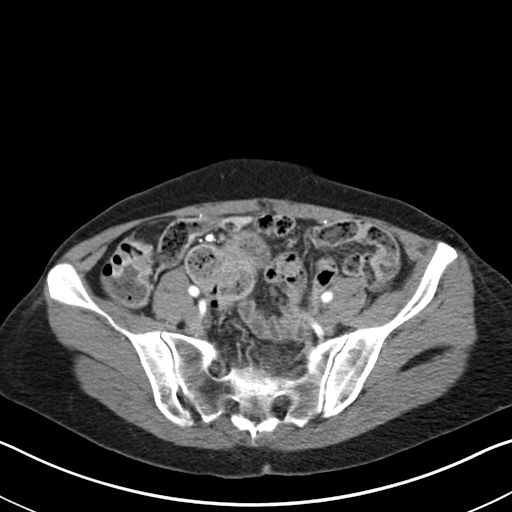
[im 40/92  soft-tissue]
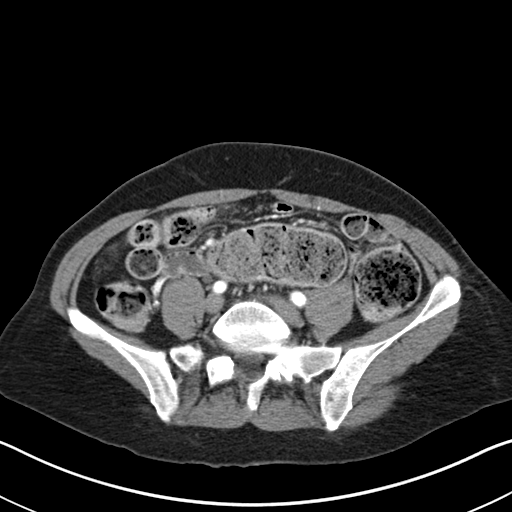
[im 53/92  soft-tissue]
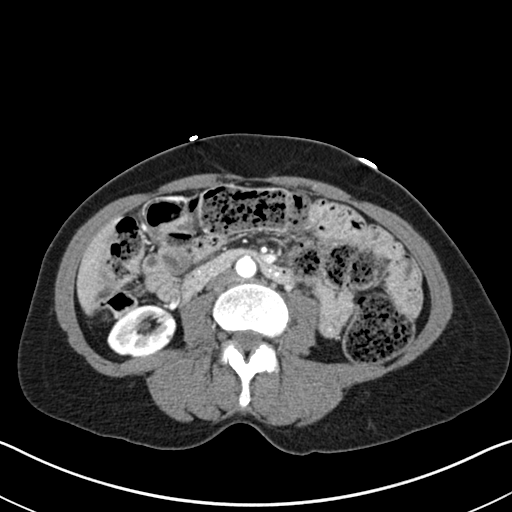
[im 59/92  soft-tissue]
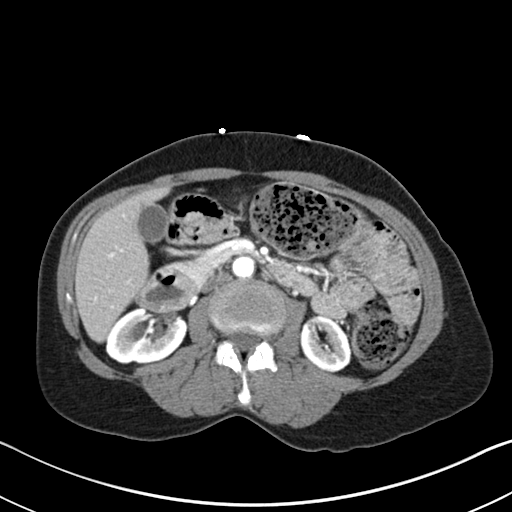
[im 66/92  soft-tissue]
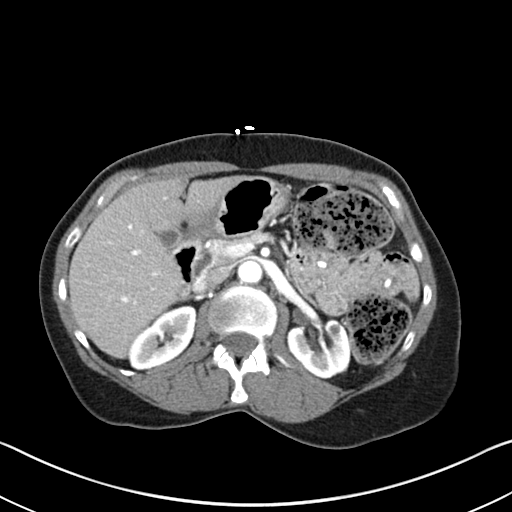
[im 66/92  bone]
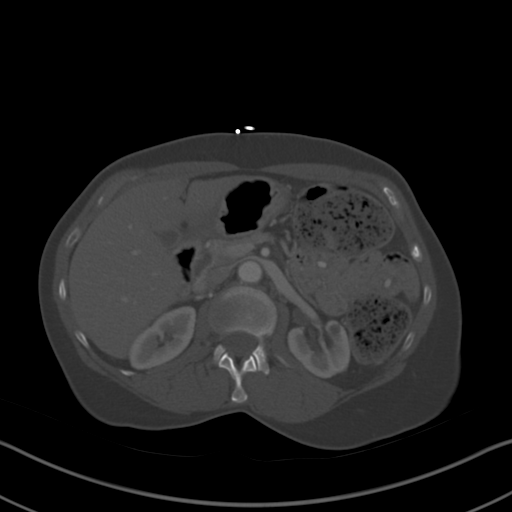
[im 72/92  soft-tissue]
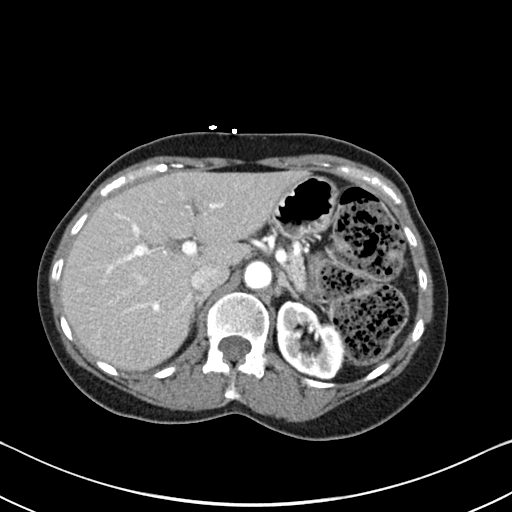
[im 79/92  soft-tissue]
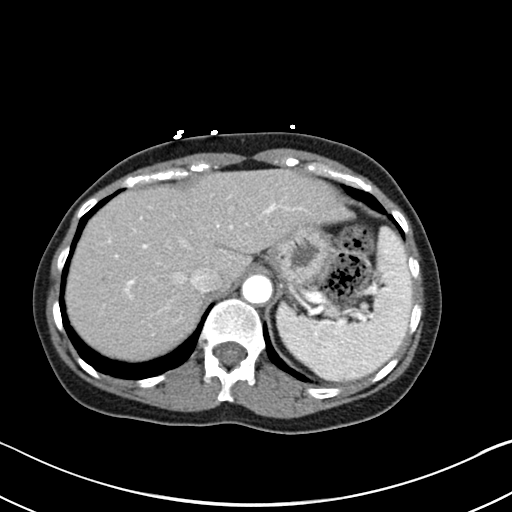
[im 85/92  soft-tissue]
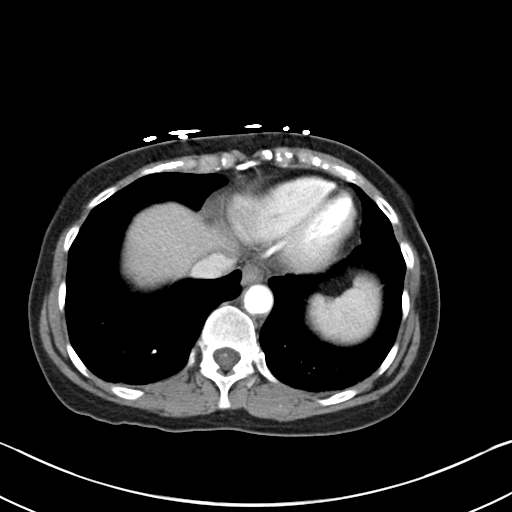

[Series 5: coronal st · coronal · 0.71mm/px · 3 of 124 slices shown]
[im 42/124  soft-tissue]
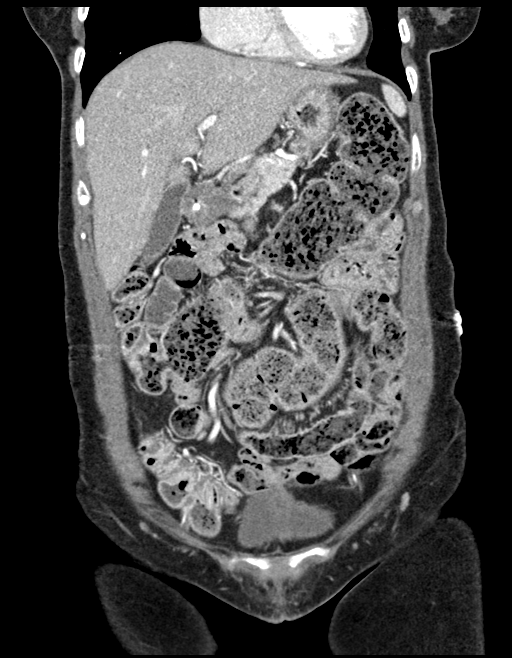
[im 55/124  soft-tissue]
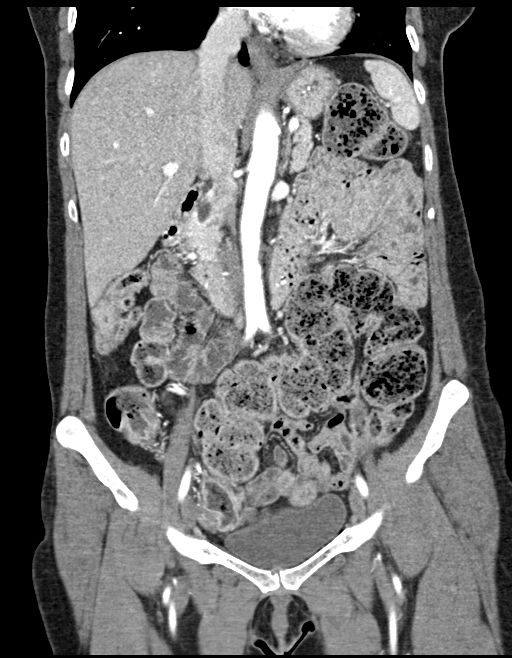
[im 69/124  soft-tissue]
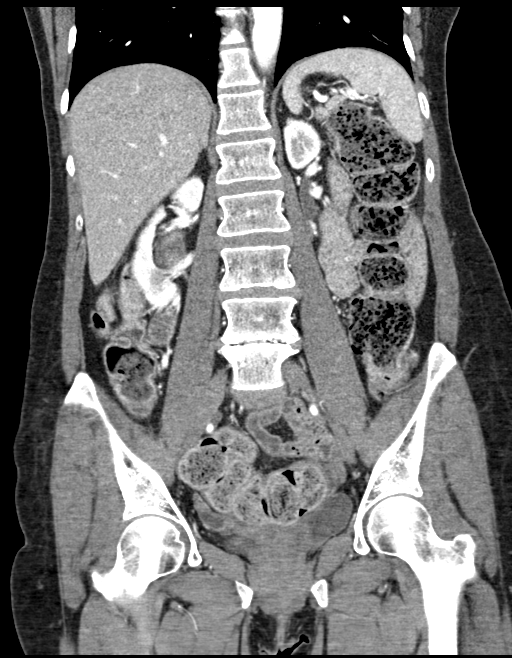

[15 of 46 positions shown; findings below may reference images not displayed]

FINDINGS: Lower chest: Minimal left basilar atelectasis. The visualized heart
and pericardium are unremarkable.

Hepatobiliary: Several small cystic lesions are identified within
the left hepatic lobe, too small to accurately characterize on this
examination. Mild hepatic steatosis. The liver is otherwise
unremarkable. No intra or extrahepatic biliary ductal dilation.

Pancreas: Unremarkable

Spleen: Unremarkable

Adrenals/Urinary Tract: The adrenal glands are unremarkable. The
kidneys are normal in size and position. Several tiny cortical cysts
are seen within the kidneys bilaterally. No enhancing intrarenal
masses. No hydronephrosis. No intrarenal or ureteral calculi. The
bladder is unremarkable.

Stomach/Bowel: There is large stool seen throughout the colon and
within the rectal vault without evidence of obstruction. The
stomach, small bowel, and large bowel are otherwise unremarkable.
Appendix normal. No free intraperitoneal gas.

Vascular/Lymphatic: There is mild aortoiliac atherosclerotic
calcification. No aortic aneurysm. No pathologic adenopathy within
the abdomen and pelvis.

Reproductive: Mild simple appearing free fluid within the pelvis is
nonspecific, but abnormal in the postmenopausal patient. Uterus
absent. No adnexal masses.

Other: The rectum is unremarkable. Tiny fat containing umbilical
hernia.

Musculoskeletal: Degenerative changes are seen within the a lumbar
spine. No lytic or blastic bone lesions are identified. No acute
bone abnormality. There are mild inflammatory changes noted along
the left gluteal crease, best seen on image # 89/2 without
associated subcutaneous fluid collection identified. The
inflammatory changes appear superficial to the subjacent gluteal
musculature.
IMPRESSION: Large stool throughout the colon without evidence of obstruction.

Mild free fluid within the pelvis, nonspecific, but abnormal in the
postmenopausal patient.

Mild subcutaneous inflammatory stranding subjacent to the left
gluteal crease

Aortic Atherosclerosis (UCLBD-VUT.T).

## 2023-01-22 ENCOUNTER — Encounter: Payer: Self-pay | Admitting: Physician Assistant
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
## 2023-02-20 ENCOUNTER — Ambulatory Visit: Payer: Medicare Other

## 2023-02-20 ENCOUNTER — Ambulatory Visit: Payer: Medicare Other | Admitting: Physician Assistant

## 2023-02-20 ENCOUNTER — Encounter: Payer: Self-pay | Admitting: Physician Assistant

## 2023-02-20 VITALS — BP 119/81 | Resp 20 | Ht 66.0 in | Wt 131.0 lb

## 2023-02-20 DIAGNOSIS — R413 Other amnesia: Secondary | ICD-10-CM | POA: Diagnosis not present

## 2023-02-20 MED ORDER — MEMANTINE HCL 5 MG PO TABS: ORAL_TABLET | ORAL | 11 refills | Status: AC

## 2023-02-20 NOTE — Patient Instructions (Addendum)
It was a pleasure to see you today at our office.   Recommendations:  Start memantine 5, take 1 tab at night for 2 weeks and then 1 tab twice a day  MRI of the brain, the radiology office will call you to arrange you appointment   Follow up jan 21 at 71m    For psychiatric meds, mood meds: Please have your primary care physician manage these medications.  If you have any severe symptoms of a stroke, or other severe issues such as confusion,severe chills or fever, etc call 911 or go to the ER as you may need to be evaluated further     For assessment of decision of mental capacity and competency:  Call Dr. Erick Blinks, geriatric psychiatrist at [phone]  Counseling regarding caregiver distress, including caregiver depression, anxiety and issues regarding community resources, adult day care programs, adult living facilities, or memory care questions:  please contact your  Primary Doctor's Social Worker   Whom to call: Memory  decline, memory medications: Call our office [phone]    [email or web address]   RECOMMENDATIONS FOR ALL PATIENTS WITH MEMORY PROBLEMS: 1. Continue to exercise (Recommend 30 minutes of walking everyday, or 3 hours every week) 2. Increase social interactions - continue going to [place] and enjoy social gatherings with friends and family 3. Eat healthy, avoid fried foods and eat more fruits and vegetables 4. Maintain adequate blood pressure, blood sugar, and blood cholesterol level. Reducing the risk of stroke and cardiovascular disease also helps promoting better memory. 5. Avoid stressful situations. Live a simple life and avoid aggravations. Organize your time and prepare for the next day in anticipation. 6. Sleep well, avoid any interruptions of sleep and avoid any distractions in the bedroom that may interfere with adequate sleep quality 7. Avoid sugar, avoid sweets as there is a strong link between  excessive sugar intake, diabetes, and cognitive impairment We discussed the Mediterranean diet, which has been shown to help patients reduce the risk of progressive memory disorders and reduces cardiovascular risk. This includes eating fish, eat fruits and green leafy vegetables, nuts like almonds and hazelnuts, walnuts, and also use olive oil. Avoid fast foods and fried foods as much as possible. Avoid sweets and sugar as sugar use has been linked to worsening of memory function.  There is always a concern of gradual progression of memory problems. If this is the case, then we may need to adjust level of care according to patient needs. Support, both to the patient and caregiver, should then be put into place.      You have been referred for a neuropsychological evaluation (i.e., evaluation of memory and thinking abilities). Please bring someone with you to this appointment if possible, as it is helpful for the doctor to hear from both you and another adult who knows you well. Please bring eyeglasses and hearing aids if you wear them.    The evaluation will take approximately 3 hours and has two parts:   The first part is a clinical interview with the neuropsychologist (Dr. Milbert Coulter or Dr. Roseanne Reno). During the interview, the neuropsychologist will speak with you and the individual you brought to the appointment.    The second part of the evaluation is testing with the doctor's technician Annabelle Harman or Selena Batten). During the testing, the technician will ask you to remember different types of material, solve problems, and answer some questionnaires. Your family member will not be present for this portion of the evaluation.   Please note: We  must reserve several hours of the neuropsychologist's time and the psychometrician's time for your evaluation appointment. As such, there is a No-Show fee of $100. If you are unable to attend any of your appointments, please contact our office as soon as possible to reschedule.       DRIVING: Regarding driving, in patients with progressive memory problems, driving will be impaired. We advise to have someone else do the driving if trouble finding directions or if minor accidents are reported. Independent driving assessment is available to determine safety of driving.   If you are interested in the driving assessment, you can contact the following:  [facility] in [place] [phone]  [facility] [phone]  [facility] [phone]  [facility] [phone] or [phone]   FALL PRECAUTIONS: Be cautious when walking. Scan the area for obstacles that may increase the risk of trips and falls. When getting up in the mornings, sit up at the edge of the bed for a few minutes before getting out of bed. Consider elevating the bed at the head end to avoid drop of blood pressure when getting up. Walk always in a well-lit room (use night lights in the walls). Avoid area rugs or power cords from appliances in the middle of the walkways. Use a walker or a cane if necessary and consider physical therapy for balance exercise. Get your eyesight checked regularly.  FINANCIAL OVERSIGHT: Supervision, especially oversight when making financial decisions or transactions is also recommended.  HOME SAFETY: Consider the safety of the kitchen when operating appliances like stoves, microwave oven, and blender. Consider having supervision and share cooking responsibilities until no longer able to participate in those. Accidents with firearms and other hazards in the house should be identified and addressed as well.   ABILITY TO BE LEFT ALONE: If patient is unable to contact 911 operator, consider using LifeLine, or when the need is there, arrange for someone to stay with patients. Smoking is a fire hazard, consider supervision or cessation. Risk of wandering should be assessed by caregiver and if detected at any point, supervision and  safe proof recommendations should be instituted.  MEDICATION SUPERVISION: Inability to self-administer medication needs to be constantly addressed. Implement a mechanism to ensure safe administration of the medications.      Mediterranean Diet A Mediterranean diet refers to food and lifestyle choices that are based on the traditions of countries located on the [place]. This way of eating has been shown to help prevent certain conditions and improve outcomes for people who have chronic diseases, like kidney disease and heart disease. What are tips for following this plan? Lifestyle  Cook and eat meals together with your family, when possible. Drink enough fluid to keep your urine clear or pale yellow. Be physically active every day. This includes: Aerobic exercise like running or swimming. Leisure activities like gardening, walking, or housework. Get 7-8 hours of sleep each night. If recommended by your health care provider, drink red wine in moderation. This means 1 glass a day for nonpregnant women and 2 glasses a day for men. A glass of wine equals 5 oz (150 mL). Reading food labels  Check the serving size of packaged foods. For foods such as rice and pasta, the serving size refers to the amount of cooked product, not dry. Check the total fat in packaged foods. Avoid foods that have saturated fat or trans fats. Check the ingredients list for added sugars, such as corn syrup. Shopping  At the  grocery store, buy most of your food from the areas near the walls of the store. This includes: Fresh fruits and vegetables (produce). Grains, beans, nuts, and seeds. Some of these may be available in unpackaged forms or large amounts (in bulk). Fresh seafood. Poultry and eggs. Low-fat dairy products. Buy whole ingredients instead of prepackaged foods. Buy fresh fruits and vegetables in-season from local farmers markets. Buy frozen fruits and vegetables in resealable bags. If you do  not have access to quality fresh seafood, buy precooked frozen shrimp or canned fish, such as tuna, salmon, or sardines. Buy small amounts of raw or cooked vegetables, salads, or olives from the deli or salad bar at your store. Stock your pantry so you always have certain foods on hand, such as olive oil, canned tuna, canned tomatoes, rice, pasta, and beans. Cooking  Cook foods with extra-virgin olive oil instead of using butter or other vegetable oils. Have meat as a side dish, and have vegetables or grains as your main dish. This means having meat in small portions or adding small amounts of meat to foods like pasta or stew. Use beans or vegetables instead of meat in common dishes like chili or lasagna. Experiment with different cooking methods. Try roasting or broiling vegetables instead of steaming or sauteing them. Add frozen vegetables to soups, stews, pasta, or rice. Add nuts or seeds for added healthy fat at each meal. You can add these to yogurt, salads, or vegetable dishes. Marinate fish or vegetables using olive oil, lemon juice, garlic, and fresh herbs. Meal planning  Plan to eat 1 vegetarian meal one day each week. Try to work up to 2 vegetarian meals, if possible. Eat seafood 2 or more times a week. Have healthy snacks readily available, such as: Vegetable sticks with hummus. Greek yogurt. Fruit and nut trail mix. Eat balanced meals throughout the week. This includes: Fruit: 2-3 servings a day Vegetables: 4-5 servings a day Low-fat dairy: 2 servings a day Fish, poultry, or lean meat: 1 serving a day Beans and legumes: 2 or more servings a week Nuts and seeds: 1-2 servings a day Whole grains: 6-8 servings a day Extra-virgin olive oil: 3-4 servings a day Limit red meat and sweets to only a few servings a month What are my food choices? Mediterranean diet Recommended Grains: Whole-grain pasta. Brown rice. Bulgar wheat. Polenta. Couscous. Whole-wheat bread. Orpah Cobb. Vegetables: Artichokes. Beets. Broccoli. Cabbage. Carrots. Eggplant. Green beans. Chard. Kale. Spinach. Onions. Leeks. Peas. Squash. Tomatoes. Peppers. Radishes. Fruits: Apples. Apricots. Avocado. Berries. Bananas. Cherries. Dates. Figs. Grapes. Lemons. Melon. Oranges. Peaches. Plums. Pomegranate. Meats and other protein foods: Beans. Almonds. Sunflower seeds. Pine nuts. Peanuts. Cod. Salmon. Scallops. Shrimp. Tuna. Tilapia. Clams. Oysters. Eggs. Dairy: Low-fat milk. Cheese. Greek yogurt. Beverages: Water. Red wine. Herbal tea. Fats and oils: Extra virgin olive oil. Avocado oil. Grape seed oil. Sweets and desserts: [place] yogurt with honey. Baked apples. Poached pears. Trail mix. Seasoning and other foods: Basil. Cilantro. Coriander. Cumin. Mint. Parsley. Sage. Rosemary. Tarragon. Garlic. Oregano. Thyme. Pepper. Balsalmic vinegar. Tahini. Hummus. Tomato sauce. Olives. Mushrooms. Limit these Grains: Prepackaged pasta or rice dishes. Prepackaged cereal with added sugar. Vegetables: Deep fried potatoes (french fries). Fruits: Fruit canned in syrup. Meats and other protein foods: Beef. Pork. Lamb. Poultry with skin. Hot dogs. Tomasa Blase. Dairy: Ice cream. Sour cream. Whole milk. Beverages: Juice. Sugar-sweetened soft drinks. Beer. Liquor and spirits. Fats and oils: Butter. Canola oil. Vegetable oil. Beef fat (tallow). Lard. Sweets and desserts: Cookies. Cakes. Pies.  Candy. Seasoning and other foods: Mayonnaise. Premade sauces and marinades. The items listed may not be a complete list. Talk with your dietitian about what dietary choices are right for you. Summary The Mediterranean diet includes both food and lifestyle choices. Eat a variety of fresh fruits and vegetables, beans, nuts, seeds, and whole grains. Limit the amount of red meat and sweets that you eat. Talk with your health care provider about whether it is safe for you to drink red wine in moderation. This means 1 glass a day for  nonpregnant women and 2 glasses a day for men. A glass of wine equals 5 oz (150 mL). This information is not intended to replace advice given to you by your health care provider. Make sure you discuss any questions you have with your health care provider. Document Released: 10/14/2015 Document Revised: 11/16/2015 Document Reviewed: 10/14/2015 Elsevier Interactive Patient Education  2017 [employer].

## 2023-03-11 ENCOUNTER — Ambulatory Visit
Admission: RE | Admit: 2023-03-11 | Discharge: 2023-03-11 | Disposition: A | Discharge status: Home / Self Care | Payer: Medicare Other | Source: Ambulatory Visit | Attending: Physician Assistant | Admitting: Physician Assistant

## 2023-03-27 ENCOUNTER — Ambulatory Visit: Payer: Medicare Other | Admitting: Physician Assistant

## 2023-03-27 ENCOUNTER — Encounter: Payer: Self-pay | Admitting: Physician Assistant

## 2023-03-27 VITALS — BP 85/65 | HR 143 | Resp 20 | Wt 132.0 lb

## 2023-03-27 DIAGNOSIS — R413 Other amnesia: Secondary | ICD-10-CM | POA: Diagnosis not present

## 2023-03-27 NOTE — Patient Instructions (Addendum)
It was a pleasure to see you today at our office.   Recommendations:  Continue memantine 5, take 1 tab at night for 2 weeks and then 1 tab twice a day  LP for diagnosis Follow up 2-3 months  Get social work involved    For psychiatric meds, mood meds: Please have your primary care physician manage these medications.  If you have any severe symptoms of a stroke, or other severe issues such as confusion,severe chills or fever, etc call 911 or go to the ER as you may need to be evaluated further     For assessment of decision of mental capacity and competency:  Call Dr. Erick Blinks, geriatric psychiatrist at [phone]  Counseling regarding caregiver distress, including caregiver depression, anxiety and issues regarding community resources, adult day care programs, adult living facilities, or memory care questions:  please contact your  Primary Doctor's Social Worker   Whom to call: Memory  decline, memory medications: Call our office [phone]    [email or web address]   RECOMMENDATIONS FOR ALL PATIENTS WITH MEMORY PROBLEMS: 1. Continue to exercise (Recommend 30 minutes of walking everyday, or 3 hours every week) 2. Increase social interactions - continue going to [place] and enjoy social gatherings with friends and family 3. Eat healthy, avoid fried foods and eat more fruits and vegetables 4. Maintain adequate blood pressure, blood sugar, and blood cholesterol level. Reducing the risk of stroke and cardiovascular disease also helps promoting better memory. 5. Avoid stressful situations. Live a simple life and avoid aggravations. Organize your time and prepare for the next day in anticipation. 6. Sleep well, avoid any interruptions of sleep and avoid any distractions in the bedroom that may interfere with adequate sleep quality 7. Avoid sugar, avoid sweets as there is a strong link between excessive sugar intake, diabetes, and  cognitive impairment We discussed the Mediterranean diet, which has been shown to help patients reduce the risk of progressive memory disorders and reduces cardiovascular risk. This includes eating fish, eat fruits and green leafy vegetables, nuts like almonds and hazelnuts, walnuts, and also use olive oil. Avoid fast foods and fried foods as much as possible. Avoid sweets and sugar as sugar use has been linked to worsening of memory function.  There is always a concern of gradual progression of memory problems. If this is the case, then we may need to adjust level of care according to patient needs. Support, both to the patient and caregiver, should then be put into place.      You have been referred for a neuropsychological evaluation (i.e., evaluation of memory and thinking abilities). Please bring someone with you to this appointment if possible, as it is helpful for the doctor to hear from both you and another adult who knows you well. Please bring eyeglasses and hearing aids if you wear them.    The evaluation will take approximately 3 hours and has two parts:   The first part is a clinical interview with the neuropsychologist (Dr. Milbert Coulter or Dr. Roseanne Reno). During the interview, the neuropsychologist will speak with you and the individual you brought to the appointment.    The second part of the evaluation is testing with the doctor's technician Annabelle Harman or Selena Batten). During the testing, the technician will ask you to remember different types of material, solve problems, and answer some questionnaires. Your family member will not be present for this portion of the evaluation.   Please note: We must reserve several hours of the neuropsychologist's time and the  psychometrician's time for your evaluation appointment. As such, there is a No-Show fee of $100. If you are unable to attend any of your appointments, please contact our office as soon as possible to reschedule.      DRIVING: Regarding driving,  in patients with progressive memory problems, driving will be impaired. We advise to have someone else do the driving if trouble finding directions or if minor accidents are reported. Independent driving assessment is available to determine safety of driving.   If you are interested in the driving assessment, you can contact the following:  [employer] in [place] [phone]  [facility] [phone]  [facility] [phone]  [facility] [phone] or [phone]   FALL PRECAUTIONS: Be cautious when walking. Scan the area for obstacles that may increase the risk of trips and falls. When getting up in the mornings, sit up at the edge of the bed for a few minutes before getting out of bed. Consider elevating the bed at the head end to avoid drop of blood pressure when getting up. Walk always in a well-lit room (use night lights in the walls). Avoid area rugs or power cords from appliances in the middle of the walkways. Use a walker or a cane if necessary and consider physical therapy for balance exercise. Get your eyesight checked regularly.  FINANCIAL OVERSIGHT: Supervision, especially oversight when making financial decisions or transactions is also recommended.  HOME SAFETY: Consider the safety of the kitchen when operating appliances like stoves, microwave oven, and blender. Consider having supervision and share cooking responsibilities until no longer able to participate in those. Accidents with firearms and other hazards in the house should be identified and addressed as well.   ABILITY TO BE LEFT ALONE: If patient is unable to contact 911 operator, consider using LifeLine, or when the need is there, arrange for someone to stay with patients. Smoking is a fire hazard, consider supervision or cessation. Risk of wandering should be assessed by caregiver and if detected at any point, supervision and safe proof recommendations should be  instituted.  MEDICATION SUPERVISION: Inability to self-administer medication needs to be constantly addressed. Implement a mechanism to ensure safe administration of the medications.      Mediterranean Diet A Mediterranean diet refers to food and lifestyle choices that are based on the traditions of countries located on the [employer] [place]. This way of eating has been shown to help prevent certain conditions and improve outcomes for people who have chronic diseases, like kidney disease and heart disease. What are tips for following this plan? Lifestyle  Cook and eat meals together with your family, when possible. Drink enough fluid to keep your urine clear or pale yellow. Be physically active every day. This includes: Aerobic exercise like running or swimming. Leisure activities like gardening, walking, or housework. Get 7-8 hours of sleep each night. If recommended by your health care provider, drink red wine in moderation. This means 1 glass a day for nonpregnant women and 2 glasses a day for men. A glass of wine equals 5 oz (150 mL). Reading food labels  Check the serving size of packaged foods. For foods such as rice and pasta, the serving size refers to the amount of cooked product, not dry. Check the total fat in packaged foods. Avoid foods that have saturated fat or trans fats. Check the ingredients list for added sugars, such as corn syrup. Shopping  At the grocery store, buy most of your food from the areas  near the walls of the store. This includes: Fresh fruits and vegetables (produce). Grains, beans, nuts, and seeds. Some of these may be available in unpackaged forms or large amounts (in bulk). Fresh seafood. Poultry and eggs. Low-fat dairy products. Buy whole ingredients instead of prepackaged foods. Buy fresh fruits and vegetables in-season from local farmers markets. Buy frozen fruits and vegetables in resealable bags. If you do not have access to quality fresh  seafood, buy precooked frozen shrimp or canned fish, such as tuna, salmon, or sardines. Buy small amounts of raw or cooked vegetables, salads, or olives from the deli or salad bar at your store. Stock your pantry so you always have certain foods on hand, such as olive oil, canned tuna, canned tomatoes, rice, pasta, and beans. Cooking  Cook foods with extra-virgin olive oil instead of using butter or other vegetable oils. Have meat as a side dish, and have vegetables or grains as your main dish. This means having meat in small portions or adding small amounts of meat to foods like pasta or stew. Use beans or vegetables instead of meat in common dishes like chili or lasagna. Experiment with different cooking methods. Try roasting or broiling vegetables instead of steaming or sauteing them. Add frozen vegetables to soups, stews, pasta, or rice. Add nuts or seeds for added healthy fat at each meal. You can add these to yogurt, salads, or vegetable dishes. Marinate fish or vegetables using olive oil, lemon juice, garlic, and fresh herbs. Meal planning  Plan to eat 1 vegetarian meal one day each week. Try to work up to 2 vegetarian meals, if possible. Eat seafood 2 or more times a week. Have healthy snacks readily available, such as: Vegetable sticks with hummus. Greek yogurt. Fruit and nut trail mix. Eat balanced meals throughout the week. This includes: Fruit: 2-3 servings a day Vegetables: 4-5 servings a day Low-fat dairy: 2 servings a day Fish, poultry, or lean meat: 1 serving a day Beans and legumes: 2 or more servings a week Nuts and seeds: 1-2 servings a day Whole grains: 6-8 servings a day Extra-virgin olive oil: 3-4 servings a day Limit red meat and sweets to only a few servings a month What are my food choices? Mediterranean diet Recommended Grains: Whole-grain pasta. Brown rice. Bulgar wheat. Polenta. Couscous. Whole-wheat bread. Orpah Cobb. Vegetables: Artichokes. Beets.  Broccoli. Cabbage. Carrots. Eggplant. Green beans. Chard. Kale. Spinach. Onions. Leeks. Peas. Squash. Tomatoes. Peppers. Radishes. Fruits: Apples. Apricots. Avocado. Berries. Bananas. Cherries. Dates. Figs. Grapes. Lemons. Melon. Oranges. Peaches. Plums. Pomegranate. Meats and other protein foods: Beans. Almonds. Sunflower seeds. Pine nuts. Peanuts. Cod. Salmon. Scallops. Shrimp. Tuna. Tilapia. Clams. Oysters. Eggs. Dairy: Low-fat milk. Cheese. Greek yogurt. Beverages: Water. Red wine. Herbal tea. Fats and oils: Extra virgin olive oil. Avocado oil. Grape seed oil. Sweets and desserts: [place] yogurt with honey. Baked apples. Poached pears. Trail mix. Seasoning and other foods: Basil. Cilantro. Coriander. Cumin. Mint. Parsley. Sage. Rosemary. Tarragon. Garlic. Oregano. Thyme. Pepper. Balsalmic vinegar. Tahini. Hummus. Tomato sauce. Olives. Mushrooms. Limit these Grains: Prepackaged pasta or rice dishes. Prepackaged cereal with added sugar. Vegetables: Deep fried potatoes (french fries). Fruits: Fruit canned in syrup. Meats and other protein foods: Beef. Pork. Lamb. Poultry with skin. Hot dogs. Tomasa Blase. Dairy: Ice cream. Sour cream. Whole milk. Beverages: Juice. Sugar-sweetened soft drinks. Beer. Liquor and spirits. Fats and oils: Butter. Canola oil. Vegetable oil. Beef fat (tallow). Lard. Sweets and desserts: Cookies. Cakes. Pies. Candy. Seasoning and other foods: Mayonnaise. Premade sauces and marinades.  The items listed may not be a complete list. Talk with your dietitian about what dietary choices are right for you. Summary The Mediterranean diet includes both food and lifestyle choices. Eat a variety of fresh fruits and vegetables, beans, nuts, seeds, and whole grains. Limit the amount of red meat and sweets that you eat. Talk with your health care provider about whether it is safe for you to drink red wine in moderation. This means 1 glass a day for nonpregnant women and 2 glasses a day for  men. A glass of wine equals 5 oz (150 mL). This information is not intended to replace advice given to you by your health care provider. Make sure you discuss any questions you have with your health care provider. Document Released: 10/14/2015 Document Revised: 11/16/2015 Document Reviewed: 10/14/2015 Elsevier Interactive Patient Education  2017 [employer].
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
## 2023-03-28 ENCOUNTER — Encounter: Payer: Self-pay | Admitting: Physician Assistant

## 2023-03-30 ENCOUNTER — Telehealth: Payer: Self-pay | Admitting: Physician Assistant

## 2023-03-30 NOTE — Telephone Encounter (Signed)
Pt called in and left a message. She stated she cancelled her lumbar puncture due to not having anyone to be able to take her to that appointment. She also stated to cancel her April follow up with our office.

## 2023-04-09 ENCOUNTER — Inpatient Hospital Stay: Admission: RE | Admit: 2023-04-09 | Payer: Medicare Other | Source: Ambulatory Visit

## 2023-04-09 ENCOUNTER — Telehealth: Payer: Self-pay

## 2023-04-09 NOTE — Telephone Encounter (Signed)
Patient cancelled LP for today 04/09/2023 and follow up 07/03/2023 for [facility]. fyi

## 2023-07-03 ENCOUNTER — Ambulatory Visit: Payer: Medicare Other | Admitting: Physician Assistant

## 2023-07-05 ENCOUNTER — Other Ambulatory Visit: Payer: Self-pay

## 2023-07-05 MED ORDER — DILTIAZEM HCL 30 MG PO TABS: ORAL_TABLET | ORAL | 0 refills | Status: DC

## 2023-09-10 ENCOUNTER — Ambulatory Visit: Attending: Physician Assistant | Admitting: Physician Assistant

## 2023-09-10 ENCOUNTER — Encounter: Payer: Self-pay | Admitting: Physician Assistant

## 2023-09-10 VITALS — BP 118/72 | HR 66 | Ht 66.0 in | Wt 136.2 lb

## 2023-09-10 DIAGNOSIS — R002 Palpitations: Secondary | ICD-10-CM

## 2023-09-10 DIAGNOSIS — Z09 Encounter for follow-up examination after completed treatment for conditions other than malignant neoplasm: Secondary | ICD-10-CM

## 2023-09-10 NOTE — Progress Notes (Unsigned)
  Cardiology Office Note   Date:  09/10/2023  ID:  [name], DOB 06-May-1960, MRN [id number] PCP: Mildred Planas, MD  [facility] Providers Cardiologist:  Lonni Cash, MD { Click to update primary MD,subspecialty MD or APP then REFRESH:1}    History of Present Illness [name] is a 63 y.o. female with past medical history of anxiety/depression, prior DVT, fibromyalgia, anemia, hypertension, hyperlipidemia, migraine and GERD.  Patient was initially referred to cardiology service in March 2023 for evaluation of palpitation.  Symptom would last up to 12 hours.  However by the time she was seen, she has no recurrence over 3 weeks.  Echocardiogram obtained on 07/03/2021 showed EF 65 to 70%, no regional wall motion abnormality, RVSP 22.4 mmHg, trivial MR, moderate pulmonic valve regurgitation.  Heart monitor obtained in April 2023 showed sinus rhythm with 5 beats run of SVT.  She was given short acting diltiazem  30 mg as needed for recurrence.  Patient presents today for evaluation of palpitation.  She describes this symptom as continuous tachycardia palpitation that would last several hours up to a day.  Symptom occurs about once every 2 months.  We will proceed with a 30-day heart monitor.  However there is a certain probability that due to you frequent nature of her palpitation, this may not be captured on the 3-day heart monitor.  I have also discussed with the patient to consider Kardia mobile device versus Apple Watch.  She is on limited income and cannot afford Apple Watch.  I will see the patient back in 2 months for reassessment.  She denies any exertional chest pain or shortness of breath  ROS: ***  Studies Reviewed      *** Risk Assessment/Calculations {Does this patient have ATRIAL FIBRILLATION?:[phone]}         Physical Exam VS:  BP 118/72   Pulse 66   Ht 5' 6 (1.676 m)   Wt 136 lb 3.2 oz (61.8 kg)   SpO2 97%   BMI 21.98 kg/m        Wt Readings  from Last 3 Encounters:  09/10/23 136 lb 3.2 oz (61.8 kg)  03/27/23 132 lb (59.9 kg)  02/20/23 131 lb (59.4 kg)    GEN: Well nourished, well developed in no acute distress NECK: No JVD; No carotid bruits CARDIAC: ***RRR, no murmurs, rubs, gallops RESPIRATORY:  Clear to auscultation without rales, wheezing or rhonchi  ABDOMEN: Soft, non-tender, non-distended EXTREMITIES:  No edema; No deformity   ASSESSMENT AND PLAN ***    {Are you ordering a CV Procedure (e.g. stress test, cath, DCCV, TEE, etc)?   Press F2        :[id number]}  Dispo: ***  Signed, Scot Ford, PA

## 2023-09-10 NOTE — Patient Instructions (Signed)
 Medication Instructions:  NO CHANGES *If you need a refill on your cardiac medications before your next appointment, please call your pharmacy*  Lab Work: NO LABS If you have labs (blood work) drawn today and your tests are completely normal, you will receive your results only by: MyChart Message (if you have MyChart) OR A paper copy in the mail If you have any lab test that is abnormal or we need to change your treatment, we will call you to review the results.  Testing/Procedures: [employer] Cardiac Event Monitor Instructions  Your physician has requested you wear your cardiac event monitor for 30 days, (1-30). [employer] may call or text to confirm a shipping address. The monitor will be sent to a land address via UPS. [employer] will not ship a monitor to a PO BOX. It typically takes 3-5 days to receive your monitor after it has been enrolled. [employer] will assist with USPS tracking if your package is delayed. The telephone number for [employer] is [phone]. Once you have received your monitor, please review the enclosed instructions. Instruction tutorials can also be viewed under help and settings on the enclosed cell phone. Your monitor has already been registered assigning a specific monitor serial # to you.  Billing and Self Pay Discount Information  [employer] has been provided the insurance information we had on file for you.  If your insurance has been updated, please call [employer] at [phone] to provide them with your updated insurance information.   [employer] offers a discounted Self Pay option for patients who have insurance that does not cover their cardiac event monitor or patients without insurance.  The discounted cost of a Self Pay Cardiac Event Monitor would be $225.00 , if the patient contacts [employer] at [phone] within 7 days of applying the monitor to make payment arrangements.  If the patient does not contact [employer] within 7 days of applying  the monitor, the cost of the cardiac event monitor will be $350.00.  Applying the monitor  Remove cell phone from case and turn it on. The cell phone works as IT consultant and needs to be within [employer] of you at all times. The cell phone will need to be charged on a daily basis. We recommend you plug the cell phone into the enclosed charger at your bedside table every night.  Monitor batteries: You will receive two monitor batteries labelled #1 and #2. These are your recorders. Plug battery #2 onto the second connection on the enclosed charger. Keep one battery on the charger at all times. This will keep the monitor battery deactivated. It will also keep it fully charged for when you need to switch your monitor batteries. A small light will be blinking on the battery emblem when it is charging. The light on the battery emblem will remain on when the battery is fully charged.  Open package of a Monitor strip. Insert battery #1 into black hood on strip and gently squeeze monitor battery onto connection as indicated in instruction booklet. Set aside while preparing skin.  Choose location for your strip, vertical or horizontal, as indicated in the instruction booklet. Shave to remove all hair from location. There cannot be any lotions, oils, powders, or colognes on skin where monitor is to be applied. Wipe skin clean with enclosed Saline wipe. Dry skin completely.  Peel paper labeled #1 off the back of the Monitor strip exposing the adhesive. Place the monitor on the chest in the vertical or horizontal position shown in the instruction booklet.  One arrow on the monitor strip must be pointing upward. Carefully remove paper labeled #2, attaching remainder of strip to your skin. Try not to create any folds or wrinkles in the strip as you apply it.  Firmly press and release the circle in the center of the monitor battery. You will hear a small beep. This is turning the monitor battery on. The  heart emblem on the monitor battery will light up every 5 seconds if the monitor battery in turned on and connected to the patient securely. Do not push and hold the circle down as this turns the monitor battery off. The cell phone will locate the monitor battery. A screen will appear on the cell phone checking the connection of your monitor strip. This may read poor connection initially but change to good connection within the next minute. Once your monitor accepts the connection you will hear a series of 3 beeps followed by a climbing crescendo of beeps. A screen will appear on the cell phone showing the two monitor strip placement options. Touch the picture that demonstrates where you applied the monitor strip.  Your monitor strip and battery are waterproof. You are able to shower, bathe, or swim with the monitor on. They just ask you do not submerge deeper than 3 feet underwater. We recommend removing the monitor if you are swimming in a lake, river, or ocean.  Your monitor battery will need to be switched to a fully charged monitor battery approximately once a week. The cell phone will alert you of an action which needs to be made.  On the cell phone, tap for details to reveal connection status, monitor battery status, and cell phone battery status. The green dots indicates your monitor is in good status. A red dot indicates there is something that needs your attention.  To record a symptom, click the circle on the monitor battery. In 30-60 seconds a list of symptoms will appear on the cell phone. Select your symptom and tap save. Your monitor will record a sustained or significant arrhythmia regardless of you clicking the button. Some patients do not feel the heart rhythm irregularities. [employer] will notify us  of any serious or critical events.  Refer to instruction booklet for instructions on switching batteries, changing strips, the Do not disturb or Pause features, or any  additional questions.  Call [employer] at [phone], to confirm your monitor is transmitting and record your baseline. They will answer any questions you may have regarding the monitor instructions at that time.  Returning the monitor to [employer]  Place all equipment back into blue box. Peel off strip of paper to expose adhesive and close box securely. There is a prepaid UPS shipping label on this box. Drop in a UPS drop box, or at a UPS facility like [employer]. You may also contact [employer] to arrange UPS to pick up monitor package at your home.   Follow-Up: At [facility], you and your health needs are our priority.  As part of our continuing mission to provide you with exceptional heart care, our providers are all part of one team.  This team includes your primary Cardiologist (physician) and Advanced Practice Providers or APPs (Physician Assistants and Nurse Practitioners) who all work together to provide you with the care you need, when you need it.  Your next appointment:   2 month(s)  Provider:   Hao Meng, PA

## 2023-09-13 ENCOUNTER — Telehealth: Payer: Self-pay | Admitting: Cardiovascular Disease

## 2023-09-13 NOTE — Telephone Encounter (Signed)
 Pt c/o medication issue:  1. Name of Medication: diltiazem  (CARDIZEM ) 30 MG tablet   2. How are you currently taking this medication (dosage and times per day)? As written   3. Are you having a reaction (difficulty breathing--STAT)? No   4. What is your medication issue? Pt would like to know if should take this medication daily or only when her heart is racing. Please advise

## 2023-09-13 NOTE — Telephone Encounter (Signed)
 Called patient back. Patient wanted to know when she starts wearing the monitor could she take the cardizem  for racing heart. Informed patient that if she starts having a racing heart while on the monitor, press the button for an alert and then take the medication. Patient stated she would need him with putting on the monitor, and she would like an appointment with the monitor tech. Will send message to them to see if they can schedule patient an appointment to come in.

## 2023-09-17 NOTE — Telephone Encounter (Signed)
 Patient scheduled to come in Wednesday, 09/19/23, at 9:00AM for have her [facility] [employer] event monitor applied.

## 2023-09-19 ENCOUNTER — Encounter

## 2023-09-19 ENCOUNTER — Telehealth: Payer: Self-pay | Admitting: Physician Assistant

## 2023-09-19 DIAGNOSIS — R002 Palpitations: Secondary | ICD-10-CM

## 2023-09-19 NOTE — Telephone Encounter (Signed)
 Pt requesting cb regarding questions about her heart monitor put on this morning

## 2023-09-19 NOTE — Progress Notes (Unsigned)
 [employer] event monitor mailed to patient 09/11/2023. Applied monitor in office 09/19/23.  Dr. Verlin to read.

## 2023-09-19 NOTE — Telephone Encounter (Signed)
 Patient had question regarding where to find the serial number of each monitor battery. Informed her the serial # is listed on the back of each monitor battery in the small white box.

## 2023-09-19 NOTE — Telephone Encounter (Signed)
   The patient called the answering service after-hours today with complaints about her monitor. She states it is too complicated to wear and she does not want to wear it. She wanted to let someone know so that we don't come knocking down her door. I offered to have monitor team call tomorrow to help troubleshoot and she politely declined, stating she won't wear it regardless. Will send message to ordering APP Scot Ford to review.  Eero Dini N Delrose Rohwer, PA-C

## 2023-09-20 NOTE — Telephone Encounter (Signed)
 Patient is following up regarding concerns with heart monitor. She would like to discuss further with Dr. Eliza since she decided not to proceed with monitor. If her call is returned tomorrow, please call after 10:00 AM because she has an appointment at 9:00 AM tomorrow.

## 2023-09-20 NOTE — Telephone Encounter (Signed)
 Returned a call back to the pt.  She wanted to let Dr. Verlin and RN know that she does not want to pursue in wearing the heart monitor, due to it being over complicated and causing skin irritation.  Offered the pt to have our monitor techs make arrangements for her to come in and assist with monitor concerns/sensitive electrodes, but pt is kindly refusing this offer at this time.  Pt is asking that we make Dr. Verlin and RN aware of this.   Informed the pt that Dr. Verlin and his nurse are out of the office at this time, but I will route this note to them for further review when they return.   Pt is aware if Dr. Verlin has any further recommendations, we will call her shortly thereafter with them.  Pt verbalized understanding and agrees with this plan.  Will also make are monitor team aware of this as well.

## 2023-09-20 NOTE — Telephone Encounter (Signed)
 Message Details Received: Today Malvina, Rico DELENA Gladis Porter CHRISTELLA, LPN Caller: Unspecified (Today,  3:38 PM)      09/19/2023 - Patient Calls: Abigail Raphael SAILOR, PA-C and others (Newest Message First)            View All Conversations on this Encounter Malvina Rico A to Me (Selected Message)     09/20/23  4:55 PM [employer] notified patient cancelled study due to skin irritation and monior being overly complicated.  Order cancelled at patient refused in EPIC.

## 2023-09-25 NOTE — Telephone Encounter (Signed)
 Thank you for the update. I will discuss with the patient alternative such as [employer] mobile device again on follow up. I think that would be the only option in her case.

## 2023-11-16 ENCOUNTER — Ambulatory Visit: Admitting: Physician Assistant

## 2023-12-13 ENCOUNTER — Ambulatory Visit

## 2023-12-17 ENCOUNTER — Ambulatory Visit: Payer: Self-pay

## 2023-12-17 NOTE — Telephone Encounter (Signed)
 Patient calling with the main objective to get a new patient appointment scheduled before 10/21. Patient has hx of migraines and states she has a medication that will run out on 10/21. No appointment availability in [place] area-did discuss options in [place] area but patient refused. Patient thanked this RN but then stated she would continue to look else where.   FYI Only or Action Required?: FYI only for provider.  Called Nurse Triage reporting Migraine.  Symptoms began today.  Interventions attempted: Prescription medications: migraine medications and Rest, hydration, or home remedies.  Symptoms are: unchanged.  Triage Disposition: See PCP Within 2 Weeks  Patient/caregiver understands and will follow disposition?: Yes     Copied from CRM [id number]. Topic: Clinical - Red Word Triage >> Dec 17, 2023 12:37 PM Suzen RAMAN wrote: Red Word that prompted transfer to Nurse Triage: severe migraine headache, requesting a NP appt the [place] location. Reason for Disposition  Headache is a chronic symptom (recurrent or ongoing AND present > 4 weeks)  Answer Assessment - Initial Assessment Questions 1. LOCATION: Where does it hurt?      Left side to the back of head 2. ONSET: When did the headache start? (e.g., minutes, hours, days)      today 3. PATTERN: Does the pain come and go, or has it been constant since it started?     constant 4. SEVERITY: How bad is the pain? and What does it keep you from doing?  (e.g., Scale 1-10; mild, moderate, or severe)     9 out of 10 5. RECURRENT SYMPTOM: Have you ever had headaches before? If Yes, ask: When was the last time? and What happened that time?      yes 6. CAUSE: What do you think is causing the headache?     migraine 7. MIGRAINE: Have you been diagnosed with migraine headaches? If Yes, ask: Is this headache similar?      yes 8. HEAD INJURY: Has there been any recent injury to your head?      no 9. OTHER  SYMPTOMS: Do you have any other symptoms? (e.g., fever, stiff neck, eye pain, sore throat, cold symptoms)     no  Protocols used: Headache-A-AH

## 2024-02-26 ENCOUNTER — Encounter (HOSPITAL_COMMUNITY): Payer: Self-pay

## 2024-02-26 ENCOUNTER — Emergency Department (HOSPITAL_COMMUNITY)
Admission: EM | Admit: 2024-02-26 | Discharge: 2024-02-26 | Disposition: A | Discharge status: Home / Self Care | Attending: Emergency Medicine | Admitting: Emergency Medicine

## 2024-02-26 ENCOUNTER — Other Ambulatory Visit: Payer: Self-pay

## 2024-02-26 ENCOUNTER — Emergency Department (HOSPITAL_COMMUNITY)

## 2024-02-26 DIAGNOSIS — R110 Nausea: Secondary | ICD-10-CM | POA: Insufficient documentation

## 2024-02-26 DIAGNOSIS — R079 Chest pain, unspecified: Secondary | ICD-10-CM | POA: Diagnosis present

## 2024-02-26 DIAGNOSIS — R61 Generalized hyperhidrosis: Secondary | ICD-10-CM | POA: Diagnosis not present

## 2024-02-26 DIAGNOSIS — R072 Precordial pain: Secondary | ICD-10-CM | POA: Diagnosis not present

## 2024-02-26 DIAGNOSIS — I10 Essential (primary) hypertension: Secondary | ICD-10-CM | POA: Insufficient documentation

## 2024-02-26 LAB — CBC
HCT: 40.9 % (ref 36.0–46.0)
Hemoglobin: 13.5 g/dL (ref 12.0–15.0)
MCH: 30.8 pg (ref 26.0–34.0)
MCHC: 33 g/dL (ref 30.0–36.0)
MCV: 93.4 fL (ref 80.0–100.0)
Platelets: 344 K/uL (ref 150–400)
RBC: 4.38 MIL/uL (ref 3.87–5.11)
RDW: 12.8 % (ref 11.5–15.5)
WBC: 8.1 K/uL (ref 4.0–10.5)
nRBC: 0 % (ref 0.0–0.2)

## 2024-02-26 LAB — BASIC METABOLIC PANEL WITH GFR
Anion gap: 11 (ref 5–15)
BUN: 13 mg/dL (ref 8–23)
CO2: 23 mmol/L (ref 22–32)
Calcium: 9 mg/dL (ref 8.9–10.3)
Chloride: 106 mmol/L (ref 98–111)
Creatinine, Ser: 0.83 mg/dL (ref 0.44–1.00)
GFR, Estimated: >60 mL/min
Glucose, Bld: 96 mg/dL (ref 70–99)
Potassium: 4.2 mmol/L (ref 3.5–5.1)
Sodium: 140 mmol/L (ref 135–145)

## 2024-02-26 LAB — HEPATIC FUNCTION PANEL
ALT: 17 U/L (ref 0–44)
AST: 24 U/L (ref 15–41)
Albumin: 4.2 g/dL (ref 3.5–5.0)
Alkaline Phosphatase: 74 U/L (ref 38–126)
Bilirubin, Direct: <0.1 mg/dL (ref 0.0–0.2)
Total Bilirubin: <0.2 mg/dL (ref 0.0–1.2)
Total Protein: 6.7 g/dL (ref 6.5–8.1)

## 2024-02-26 LAB — TROPONIN T, HIGH SENSITIVITY
Troponin T High Sensitivity: <15 ng/L (ref 0–19)
Troponin T High Sensitivity: <15 ng/L (ref 0–19)

## 2024-02-26 LAB — LIPASE, BLOOD: Lipase: 21 U/L (ref 11–51)

## 2024-02-26 MED ORDER — NITROGLYCERIN 0.4 MG SL SUBL
0.4000 mg | SUBLINGUAL_TABLET | SUBLINGUAL | Status: DC | PRN
  Administered 2024-02-26: 0.4 mg via SUBLINGUAL
  Filled 2024-02-26: qty 1

## 2024-02-26 MED ORDER — MORPHINE SULFATE (PF) 4 MG/ML IV SOLN
4.0000 mg | Freq: Once | INTRAVENOUS | Status: AC
  Administered 2024-02-26: 4 mg via INTRAVENOUS
  Filled 2024-02-26: qty 1

## 2024-02-26 MED ORDER — ALUM & MAG HYDROXIDE-SIMETH 200-200-20 MG/5ML PO SUSP
30.0000 mL | Freq: Once | ORAL | Status: AC
  Administered 2024-02-26: 30 mL via ORAL
  Filled 2024-02-26: qty 30

## 2024-02-26 MED ORDER — ONDANSETRON HCL 4 MG/2ML IJ SOLN
4.0000 mg | Freq: Once | INTRAMUSCULAR | Status: AC
  Administered 2024-02-26: 4 mg via INTRAVENOUS
  Filled 2024-02-26: qty 2

## 2024-02-26 NOTE — Discharge Instructions (Signed)

## 2024-02-26 NOTE — ED Triage Notes (Signed)
 Patient BIB [facility] from home due to chest pain. Patient reports chest pain that started at 3pm, took 3 baby aspirin, and had no improvement. Patient reports pain 7/10 centralized, non radiating. Patient A&Ox4.

## 2024-02-26 NOTE — ED Provider Notes (Signed)
 "  Emergency Department Provider Note   I have reviewed the triage vital signs and the nursing notes.   HISTORY  Chief Complaint Chest Pain   HPI [name] is a 63 y.o. female past history of remote DVT not on anticoagulation, fibromyalgia, hypertension, hyperlipidemia presents to the emergency department with chest heaviness.  Symptoms began around 3 PM.  She reports some mild nausea and diaphoresis.  She took 3 baby aspirin with no relief in symptoms.  EMS arrived and transported to the emergency department.  No nitroglycerin  or other medications given and route.  Patient denies any history of ACS.  No sharp, pleuritic pain symptoms.    Past Medical History:  Diagnosis Date   Anemia    Anxiety    Chronic back pain    Chronic constipation    Depression    DVT (deep venous thrombosis) (HCC)    Fibromyalgia    GERD (gastroesophageal reflux disease)    Hayfever    Hyperlipidemia    Hypertension    Internal hemorrhoids    Left ureteral calculus    Migraines    Vitamin D deficiency     Review of Systems  Constitutional: No fever/chills Cardiovascular: Positive chest pain. Respiratory: Positive shortness of breath. Gastrointestinal: No abdominal pain.  No nausea, no vomiting.   Neurological: Negative for headaches. ____________________________________________   PHYSICAL EXAM:  VITAL SIGNS: ED Triage Vitals  Encounter Vitals Group     BP 02/26/24 1926 (!) 155/92     Pulse Rate 02/26/24 1926 91     Resp 02/26/24 1926 18     Temp 02/26/24 1926 99.4 F (37.4 C)     Temp Source 02/26/24 1926 Oral     SpO2 02/26/24 1919 94 %     Weight 02/26/24 1922 140 lb (63.5 kg)     Height 02/26/24 1922 5' 6 (1.676 m)   Constitutional: Alert and oriented. Well appearing and in no acute distress. Eyes: Conjunctivae are normal.  Head: Atraumatic. Nose: No congestion/rhinnorhea. Mouth/Throat: Mucous membranes are moist.  Oropharynx non-erythematous. Neck: No stridor.   Cardiovascular: Normal rate, regular rhythm. Good peripheral circulation. Grossly normal heart sounds.   Respiratory: Normal respiratory effort.  No retractions. Lungs CTAB. Gastrointestinal: Soft and nontender. No distention.  Musculoskeletal: No gross deformities of extremities. No LE edema.  Neurologic:  Normal speech and language.  Skin:  Skin is warm, dry and intact. No rash noted.   ____________________________________________   LABS (all labs ordered are listed, but only abnormal results are displayed)  Labs Reviewed  BASIC METABOLIC PANEL WITH GFR  CBC  HEPATIC FUNCTION PANEL  LIPASE, BLOOD  TROPONIN T, HIGH SENSITIVITY  TROPONIN T, HIGH SENSITIVITY   ____________________________________________  EKG   EKG Interpretation Date/Time:  Tuesday February 26 2024 19:49:17 EST Ventricular Rate:  84 PR Interval:    QRS Duration:  96 QT Interval:  371 QTC Calculation: 439 R Axis:   70  Text Interpretation: Normal sinus rhythm Borderline repolarization abnormality When compared with ECG of EARLIER SAME DATE No significant change was found Confirmed by Raford Lenis ([id number]) on 02/26/2024 11:04:20 PM        ____________________________________________   PROCEDURES  Procedure(s) performed:   Procedures  None  ____________________________________________   INITIAL IMPRESSION / ASSESSMENT AND PLAN / ED COURSE  Pertinent labs & imaging results that were available during my care of the patient were reviewed by me and considered in my medical decision making (see chart for details).   This  patient is Presenting for Evaluation of CP, which does require a range of treatment options, and is a complaint that involves a high risk of morbidity and mortality.  The Differential Diagnoses includes but is not exclusive to acute coronary syndrome, aortic dissection, pulmonary embolism, cardiac tamponade, community-acquired pneumonia, pericarditis, musculoskeletal chest wall  pain, etc.   Critical Interventions-    Medications  alum & mag hydroxide-simeth (MAALOX/MYLANTA) 200-200-20 MG/5ML suspension 30 mL (30 mLs Oral Given 02/26/24 2019)  morphine  (PF) 4 MG/ML injection 4 mg (4 mg Intravenous Given 02/26/24 2020)  ondansetron  (ZOFRAN ) injection 4 mg (4 mg Intravenous Given 02/26/24 2020)  ondansetron  (ZOFRAN ) injection 4 mg (4 mg Intravenous Given 02/26/24 2317)    Reassessment after intervention: symptoms improved.    Clinical Laboratory Tests Ordered, included CBC without leukocytosis or anemia.  Basic metabolic panel shows normal creatinine.  Troponin normal.  Radiologic Tests Ordered, included CXR. I independently interpreted the images and agree with radiology interpretation.   Cardiac Monitor Tracing which shows NSR.    Social Determinants of Health Risk patient is a non-smoker.    Medical Decision Making: Summary:  The patient presents emergency department with central chest heaviness with shortness of breath.  No sharp, pleuritic pain.  No hypoxemia.  No tachycardia.  Plan for pain management in the ED along with evaluation for ACS.  Reevaluation with update and discussion with patient. ED workup reassuring. Plan for discharge with Cardiology referral and strict ED return precautions.   Patient's presentation is most consistent with acute presentation with potential threat to life or bodily function.   Disposition: discharge  ____________________________________________  FINAL CLINICAL IMPRESSION(S) / ED DIAGNOSES  Final diagnoses:  Precordial chest pain    Note:  This document was prepared using Dragon voice recognition software and may include unintentional dictation errors.  Fonda Law, MD, [facility]    Basilio Meadow, Fonda MATSU, MD 03/01/24 [phone]  "

## 2024-03-05 ENCOUNTER — Telehealth: Payer: Self-pay | Admitting: Cardiovascular Disease

## 2024-03-05 NOTE — Telephone Encounter (Signed)
 Patient c/o Palpitations: STAT if patient c/o lightheadedness, shortness of breath, or chest pain  How long have you had palpitations/irregular HR/ Afib? Are you having the symptoms now?  Has been having palpitations for the past year. PCP finally advised to follow up with cardiology. No symptoms currently  Are you currently experiencing lightheadedness, SOB or CP?  No   Do you have a history of afib (atrial fibrillation) or irregular heart rhythm?  No   Have you checked your BP or HR? (document readings if available):  Not recently, but she says whenever she checks it is normal  Are you experiencing any other symptoms?  No

## 2024-03-05 NOTE — Telephone Encounter (Signed)
 Pt reports that she has dizziness, nausea, and light-headed when she has palpitations. She wants to get in to see Dr Verlin (has not seen him since 2023) she last saw Hao Meng, PA-C, and was unable to wear the monitor that was ordered due to an allergic reaction to the adhesive and it was too complicated.  Asked her what her hr is- she said that it is usually very high, but she doesn't have her monitor in the room with her at this time. I asked her if her hr ever goes down low and she reports that is always high.   Asked her if she has tried taking the diltiazem  30 mg prn for palpitations. The patient reports that she cannot take diltazem, it causes dizziness and nausea. This was on her med list to take prn, but she cannot take it. Medication taken off list.   She was recently seen in the ER due to palpitations, but they did not give her anything.   Informed her that I will send this information to her provider and see if there is any medication that she can take before her appt with the Dr on 03/20/24. Instructed to stay well hydrated and to eat regularly; also move slowly when making position changes Given ER precautions. She verbalized understanding.

## 2024-03-10 MED ORDER — METOPROLOL SUCCINATE ER 25 MG PO TB24: 25.0000 mg | ORAL_TABLET | Freq: Every day | ORAL | 6 refills | Status: AC

## 2024-03-10 NOTE — Telephone Encounter (Signed)
 I spoke with patient and gave her message from Dr Verlin.  She would like to start Toprol  25 mg daily.  Prescription sent to [facility]
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
## 2024-03-19 ENCOUNTER — Encounter: Payer: Self-pay | Admitting: Physician Assistant

## 2024-03-19 ENCOUNTER — Ambulatory Visit: Admitting: Physician Assistant

## 2024-03-19 VITALS — BP 112/73 | HR 96 | Resp 18 | Ht 66.0 in | Wt 140.0 lb

## 2024-03-19 DIAGNOSIS — G43711 Chronic migraine without aura, intractable, with status migrainosus: Secondary | ICD-10-CM | POA: Diagnosis not present

## 2024-03-19 MED ORDER — NURTEC 75 MG PO TBDP: 75.0000 mg | ORAL_TABLET | Freq: Once | ORAL | 11 refills | Status: AC | PRN

## 2024-03-19 MED ORDER — AIMOVIG 140 MG/ML ~~LOC~~ SOAJ: 140.0000 mg | SUBCUTANEOUS | 11 refills | Status: DC

## 2024-03-19 NOTE — Progress Notes (Signed)
 "  Chief Complaint  Patient presents with   Follow-up    Chest pain   History of Present Illness: 64 yo female with history of anemia, anxiety, depression, prior DVT, fibromyalgia, GERD, HTN, hyperlipidemia and migraines here today for follow up. I saw her as a new patient for the evaluation of palpitations in March 2023. Echo April 2023 with LVEF=65-70%. Moderate pulmonary valve insufficiency. Cardiac monitor March 2023 with sinus with 5 beat run of SVT, PVCs. She was seen in the ED on 02/26/24 with chest pain. Troponin negative x 2. EKG without ischemic changes.  She is here today for follow up. She tells me that she has been well. She denies any dyspnea, palpitations, lower extremity edema, orthopnea, PND, dizziness, near syncope or syncope.   Primary Care Physician: Wendolyn Jenkins Jansky, MD  Past Medical History:  Diagnosis Date   Anemia    Anxiety    Chronic back pain    Chronic constipation    Depression    DVT (deep venous thrombosis) (HCC)    Fibromyalgia    GERD (gastroesophageal reflux disease)    Hayfever    Hyperlipidemia    Hypertension    Internal hemorrhoids    Left ureteral calculus    Migraines    Vitamin D deficiency     Past Surgical History:  Procedure Laterality Date   BUNIONECTOMY  2009   CESAREAN SECTION  1984   PARTIAL HYSTERECTOMY  2001   WISDOM TOOTH EXTRACTION      Current Outpatient Medications  Medication Sig Dispense Refill   ALPRAZolam  (XANAX ) 1 MG tablet Take 1 mg by mouth 4 (four) times daily.     buPROPion  (WELLBUTRIN  XL) 150 MG 24 hr tablet Take 450 mg by mouth daily.     desonide (DESOWEN) 0.05 % ointment Apply topically.     ezetimibe (ZETIA) 10 MG tablet Take 10 mg by mouth daily.     FERREX 150 150 MG capsule Take 150 mg by mouth daily.     gabapentin  (NEURONTIN ) 600 MG tablet Take 600 mg by mouth daily at 6 (six) AM.     lactulose, encephalopathy, (CHRONULAC) 10 GM/15ML SOLN Take 30 g by mouth.     lamoTRIgine  (LAMICTAL ) 100 MG  tablet Take 100 mg by mouth 2 (two) times daily.     memantine  (NAMENDA ) 5 MG tablet Take one tab at night for 2 weeks and increase to 1 tab twice a day 60 tablet 11   metoprolol  succinate (TOPROL  XL) 25 MG 24 hr tablet Take 1 tablet (25 mg total) by mouth daily. 30 tablet 6   mirtazapine  (REMERON ) 30 MG tablet Take 30 mg by mouth at bedtime.     Multiple Vitamin (MULTIVITAMIN) tablet Take 1 tablet by mouth every evening.     promethazine (PHENERGAN) 12.5 MG suppository Place 12.5 mg rectally every 6 (six) hours as needed.     [START ON 04/16/2024] Rimegepant Sulfate (NURTEC) 75 MG TBDP Take 1 tablet (75 mg total) by mouth once as needed for up to 1 dose. 10 tablet 11   SENNA S 8.6-50 MG tablet Take 4-5 tablets by mouth See admin instructions. Take 4 tablets by mouth in the morning, and 5 tablets at night     SUMAtriptan  (IMITREX ) 100 MG tablet Take 100 mg by mouth at bedtime.     traZODone  (DESYREL ) 150 MG tablet Take 150 mg by mouth at bedtime.     valbenazine (INGREZZA) 80 MG capsule Take 80 mg by mouth  at bedtime.     No current facility-administered medications for this visit.    Allergies  Allergen Reactions   Mirapex [Pramipexole Dihydrochloride] Nausea Only   Morphine      Vomiting and dairrhea   Other Nausea And Vomiting   Sulfa Drugs Cross Reactors Other (See Comments)   Penicillins Other (See Comments)    Yeast infection, nausea Has patient had a PCN reaction causing immediate rash, facial/tongue/throat swelling, SOB or lightheadedness with hypotension: No Has patient had a PCN reaction causing severe rash involving mucus membranes or skin necrosis: No Has patient had a PCN reaction that required hospitalization No Has patient had a PCN reaction occurring within the last 10 years: No If all of the above answers are NO, then may proceed wi    Social History   Socioeconomic History   Marital status: Widowed    Spouse name: Not on file   Number of children: 1   Years of  education: 59   Highest education level: Not on file  Occupational History   Occupation: Disability   Occupation: Disabliity  Tobacco Use   Smoking status: Never   Smokeless tobacco: Never  Vaping Use   Vaping status: Never Used  Substance and Sexual Activity   Alcohol use: No   Drug use: No   Sexual activity: Not Currently  Other Topics Concern   Not on file  Social History Narrative   Right handed   Drinks caffeine free   One son in prison   On disability   Lives alone   One floor home   Social Drivers of Health   Tobacco Use: Low Risk (03/20/2024)   Patient History    Smoking Tobacco Use: Never    Smokeless Tobacco Use: Never    Passive Exposure: Not on file  Financial Resource Strain: Not on file  Food Insecurity: High Risk (03/31/2023)   Received from [facility]   Epic    Within the past 12 months, you worried that your food would run out before you got money to buy more: Never true    Within the past 12 months, the food you bought just didn't last and you didn't have money to get more. : Often true  Transportation Needs: No Transportation Needs (03/31/2023)   Received from [facility]    In the past 12 months, has lack of reliable transportation kept you from medical appointments, meetings, work or from getting things needed for daily living? : No  Physical Activity: Not on file  Stress: Not on file  Social Connections: Not on file  Intimate Partner Violence: Not on file  Depression (EYV7-0): Not on file  Alcohol Screen: Not on file  Housing: Low Risk (03/31/2023)   Received from [facility]   Epic    What is your living situation today?: Pt declined to answer    Think about the place you live. Do you have problems with any of the following? Choose all that apply:: None/None on this list  Utilities: Low Risk (03/31/2023)   Received from [facility]   Utilities    In the past 12 months has the electric, gas, oil, or water company  threatened to shut off services in your home? : No  Health Literacy: Not on file    Family History  Problem Relation Age of Onset   Osteoarthritis Mother    Bipolar disorder Mother    Dementia Mother    Hyperlipidemia Father    Alzheimer's disease  Father    Hypertension Father    Diabetes Maternal Grandmother    Stroke Maternal Grandfather    Colon cancer Neg Hx     Review of Systems:  As stated in the HPI and otherwise negative.   BP 108/70   Pulse 66   Ht 5' 6 (1.676 m)   Wt 141 lb (64 kg)   SpO2 98%   BMI 22.76 kg/m   Physical Examination: General: Well developed, well nourished, NAD  HEENT: OP clear, mucus membranes moist  SKIN: warm, dry. No rashes. Neuro: No focal deficits  Musculoskeletal: Muscle strength 5/5 all ext  Psychiatric: Mood and affect normal  Neck: No JVD, no carotid bruits, no thyromegaly, no lymphadenopathy.  Lungs:Clear bilaterally, no wheezes, rhonci, crackles Cardiovascular: Regular rate and rhythm. No murmurs, gallops or rubs. Abdomen:Soft. Bowel sounds present. Non-tender.  Extremities: No lower extremity edema. Pulses are 2 + in the bilateral DP/PT.  EKG:  EKG is ordered today. The ekg ordered today demonstrates sinus  Recent Labs: 02/26/2024: ALT 17; BUN 13; Creatinine, Ser 0.83; Hemoglobin 13.5; Platelets 344; Potassium 4.2; Sodium 140   Lipid Panel No results found for: CHOL, TRIG, HDL, CHOLHDL, VLDL, LDLCALC, LDLDIRECT   Wt Readings from Last 3 Encounters:  03/20/24 141 lb (64 kg)  03/19/24 140 lb (63.5 kg)  02/26/24 140 lb (63.5 kg)    Assessment and Plan:   1. SVT/PVCs: She has no recent palpitations.  -Continue Toprol   2. Chest pain: No exertional chest pain. This was an isolated episode 3 weeks ago and her ED workup was negative.   Labs/ tests ordered today include:  No orders of the defined types were placed in this encounter.  Disposition:   F/U with me in one year  Signed, Lonni Cash,  MD 03/20/2024 10:23 AM    [facility] [address] [place], [place], [place]  [address] Phone: [phone]; Fax: [phone]    "

## 2024-03-19 NOTE — Patient Instructions (Signed)
" °  Start Nurtec 75 mg take 1 tab at the onset of HA, no more than once.     Start Aimovig  140 mg injection every 30 days  Limit use of pain relievers to no more than 2 days out of the week.  These medications include acetaminophen , NSAIDs (ibuprofen/Advil/Motrin, naproxen/Aleve, Excedrin, and narcotics.  This will help reduce risk of rebound headaches. Be aware of common food triggers:  - Caffeine:  coffee, black tea, cola, Mt. Dew  - Chocolate  - Dairy:  aged cheeses (brie, blue, cheddar, gouda, Parmasan, provolone, romano, Swiss, etc), chocolate milk, buttermilk, sour cream, limit eggs and yogurt  - Nuts, peanut butter  - Alcohol  - Cereals/grains:  FRESH breads (fresh bagels, sourdough, doughnuts), yeast productions  - Processed/canned/aged/cured meats (pre-packaged deli meats, hotdogs)  - MSG/glutamate:  soy sauce, flavor enhancer, pickled/preserved/marinated foods  - Sweeteners:  aspartame (Equal, Nutrasweet).  Sugar and Splenda are okay  - Vegetables:  legumes (lima beans, lentils, snow peas, fava beans, pinto peans, peas, garbanzo beans), sauerkraut, onions, olives, pickles  - Fruit:  avocados, bananas, citrus fruit (orange, lemon, grapefruit), mango  - Other:  Frozen meals, macaroni and cheese Routine exercise Stay adequately hydrated (aim for 64 oz water daily) Keep headache diary Maintain proper stress management Maintain proper sleep hygiene Do not skip meals Consider supplements:  magnesium  citrate 400mg  daily, riboflavin 400mg  daily, coenzyme Q10 100mg  three times daily.  "

## 2024-03-20 ENCOUNTER — Encounter: Payer: Self-pay | Admitting: Cardiovascular Disease

## 2024-03-20 ENCOUNTER — Ambulatory Visit: Attending: Cardiovascular Disease | Admitting: Cardiovascular Disease

## 2024-03-20 ENCOUNTER — Telehealth: Payer: Self-pay | Admitting: Physician Assistant

## 2024-03-20 VITALS — BP 108/70 | HR 66 | Ht 66.0 in | Wt 141.0 lb

## 2024-03-20 DIAGNOSIS — R002 Palpitations: Secondary | ICD-10-CM | POA: Diagnosis not present

## 2024-03-20 NOTE — Patient Instructions (Signed)

## 2024-03-20 NOTE — Telephone Encounter (Signed)
 I advised medications have been sent to authroization for review.

## 2024-03-20 NOTE — Telephone Encounter (Signed)
 Pt called in this morning and she was told by her pharmacy that the price for the Aimovig  will be a $1,000 and Pt stated that she can not afford that. Pt also stated that Aimovig  causes constipations and she can not do that. Thanks

## 2024-03-20 NOTE — Telephone Encounter (Signed)
 I sent to prior authorization will update on this later.

## 2024-03-24 ENCOUNTER — Telehealth: Payer: Self-pay | Admitting: Physician Assistant

## 2024-03-24 ENCOUNTER — Telehealth: Payer: Self-pay

## 2024-03-24 NOTE — Telephone Encounter (Signed)
 Pt called in this morning and she stated that she went online and filled out an application for Nurtec. Pt stated that she fax the documents to our office. Waiting on the fax. Thanks

## 2024-03-24 NOTE — Telephone Encounter (Signed)
 Sent form off to Patient assitance program for Nurtec.

## 2024-03-27 ENCOUNTER — Telehealth: Payer: Self-pay | Admitting: Physician Assistant

## 2024-03-27 NOTE — Telephone Encounter (Signed)
 Pt called in this afternoon. Pt stated that she contact [employer]  for the Nurtec and she was told that the application they received was missing signatures and pages. Pt stated that [employer]  will refax documents to be fill back out to try to get assistance.  Pt will also fax over her documents as well that she complete for [employer] so all the documents can be faxed to [employer] at the same time Thanks

## 2024-04-01 ENCOUNTER — Ambulatory Visit: Admitting: Family Medicine

## 2024-04-09 ENCOUNTER — Telehealth: Payer: Self-pay | Admitting: Physician Assistant

## 2024-04-09 ENCOUNTER — Telehealth: Payer: Self-pay

## 2024-04-09 NOTE — Telephone Encounter (Signed)
 [name] wanting to speak with [name] regarding papers for Nurtec. She stated she went to [employer] to fax them. She called to check on the status of everything and was informed that some pages were missing. Will bring in the morning.

## 2024-04-09 NOTE — Telephone Encounter (Signed)
 [employer] forms filled out from Here, patient needs to complete her form and let me know if she needs me to mail to her or come by office to fill out the assistance program.

## 2024-04-09 NOTE — Telephone Encounter (Signed)
 Noted will see her in the am

## 2024-06-18 ENCOUNTER — Ambulatory Visit: Payer: Self-pay | Admitting: Physician Assistant

## 2024-07-24 ENCOUNTER — Ambulatory Visit: Admitting: Family Medicine
